# Patient Record
Sex: Male | Born: 1950 | Race: Black or African American | Hispanic: No | Marital: Married | Smoking: Former smoker
Health system: Southern US, Community
[De-identification: ages and names within clinical notes are randomized; demographics above are authoritative.]

## PROBLEM LIST (undated history)

## (undated) DIAGNOSIS — E119 Type 2 diabetes mellitus without complications: Secondary | ICD-10-CM

## (undated) DIAGNOSIS — K219 Gastro-esophageal reflux disease without esophagitis: Secondary | ICD-10-CM

## (undated) DIAGNOSIS — N529 Male erectile dysfunction, unspecified: Secondary | ICD-10-CM

## (undated) DIAGNOSIS — E291 Testicular hypofunction: Secondary | ICD-10-CM

## (undated) HISTORY — PX: OTHER SURGICAL HISTORY: SHX169

## (undated) HISTORY — DX: Gastro-esophageal reflux disease without esophagitis: K21.9

## (undated) HISTORY — DX: Male erectile dysfunction, unspecified: N52.9

## (undated) HISTORY — PX: TONSILLECTOMY: SUR1361

## (undated) HISTORY — DX: Testicular hypofunction: E29.1

---

## 1998-07-31 ENCOUNTER — Emergency Department (HOSPITAL_COMMUNITY): Admission: EM | Admit: 1998-07-31 | Discharge: 1998-07-31 | Payer: Self-pay | Admitting: Emergency Medicine

## 1999-02-22 ENCOUNTER — Ambulatory Visit (HOSPITAL_COMMUNITY): Admission: RE | Admit: 1999-02-22 | Discharge: 1999-02-22 | Payer: Self-pay | Admitting: Cardiology

## 1999-02-22 ENCOUNTER — Encounter: Payer: Self-pay | Admitting: Cardiology

## 2000-07-16 ENCOUNTER — Emergency Department (HOSPITAL_COMMUNITY): Admission: EM | Admit: 2000-07-16 | Discharge: 2000-07-16 | Payer: Self-pay | Admitting: Emergency Medicine

## 2001-04-01 ENCOUNTER — Encounter: Payer: Self-pay | Admitting: Cardiology

## 2001-04-01 ENCOUNTER — Ambulatory Visit (HOSPITAL_COMMUNITY): Admission: RE | Admit: 2001-04-01 | Discharge: 2001-04-01 | Payer: Self-pay | Admitting: Cardiology

## 2002-02-26 ENCOUNTER — Ambulatory Visit (HOSPITAL_COMMUNITY): Admission: RE | Admit: 2002-02-26 | Discharge: 2002-02-26 | Payer: Self-pay | Admitting: Cardiology

## 2003-06-03 ENCOUNTER — Emergency Department (HOSPITAL_COMMUNITY): Admission: EM | Admit: 2003-06-03 | Discharge: 2003-06-03 | Payer: Self-pay | Admitting: Emergency Medicine

## 2004-12-19 ENCOUNTER — Ambulatory Visit: Payer: Self-pay | Admitting: Internal Medicine

## 2006-11-29 ENCOUNTER — Encounter: Admission: RE | Admit: 2006-11-29 | Discharge: 2006-11-29 | Payer: Self-pay | Admitting: Cardiology

## 2008-05-04 ENCOUNTER — Encounter: Admission: RE | Admit: 2008-05-04 | Discharge: 2008-05-04 | Payer: Self-pay | Admitting: Cardiology

## 2008-10-17 ENCOUNTER — Emergency Department (HOSPITAL_COMMUNITY): Admission: EM | Admit: 2008-10-17 | Discharge: 2008-10-17 | Payer: Self-pay | Admitting: Emergency Medicine

## 2008-10-17 ENCOUNTER — Emergency Department (HOSPITAL_COMMUNITY): Admission: EM | Admit: 2008-10-17 | Discharge: 2008-10-18 | Payer: Self-pay | Admitting: Emergency Medicine

## 2009-05-06 ENCOUNTER — Encounter: Admission: RE | Admit: 2009-05-06 | Discharge: 2009-05-06 | Payer: Self-pay | Admitting: Cardiology

## 2009-05-16 ENCOUNTER — Emergency Department (HOSPITAL_COMMUNITY): Admission: EM | Admit: 2009-05-16 | Discharge: 2009-05-16 | Payer: Self-pay | Admitting: Family Medicine

## 2009-10-28 ENCOUNTER — Encounter: Admission: RE | Admit: 2009-10-28 | Discharge: 2009-10-28 | Payer: Self-pay | Admitting: Cardiology

## 2010-07-27 DIAGNOSIS — R634 Abnormal weight loss: Secondary | ICD-10-CM | POA: Insufficient documentation

## 2010-07-27 DIAGNOSIS — E119 Type 2 diabetes mellitus without complications: Secondary | ICD-10-CM | POA: Insufficient documentation

## 2010-07-27 DIAGNOSIS — R42 Dizziness and giddiness: Secondary | ICD-10-CM | POA: Insufficient documentation

## 2010-07-27 DIAGNOSIS — R632 Polyphagia: Secondary | ICD-10-CM | POA: Insufficient documentation

## 2010-07-27 DIAGNOSIS — R631 Polydipsia: Secondary | ICD-10-CM | POA: Insufficient documentation

## 2010-07-27 DIAGNOSIS — R5381 Other malaise: Secondary | ICD-10-CM | POA: Insufficient documentation

## 2010-07-28 ENCOUNTER — Emergency Department (HOSPITAL_COMMUNITY)
Admission: EM | Admit: 2010-07-28 | Discharge: 2010-07-28 | Disposition: A | Discharge status: Home / Self Care | Payer: Self-pay | Attending: Emergency Medicine | Admitting: Emergency Medicine

## 2010-07-28 LAB — URINALYSIS, ROUTINE W REFLEX MICROSCOPIC
Ketones, ur: 15 mg/dL — AB
Leukocytes, UA: NEGATIVE
Nitrite: NEGATIVE
Protein, ur: NEGATIVE mg/dL
Urobilinogen, UA: 0.2 mg/dL (ref 0.0–1.0)

## 2010-07-28 LAB — DIFFERENTIAL
Basophils Absolute: 0.1 10*3/uL (ref 0.0–0.1)
Basophils Relative: 1 % (ref 0–1)
Eosinophils Absolute: 0.6 10*3/uL (ref 0.0–0.7)
Monocytes Absolute: 0.6 10*3/uL (ref 0.1–1.0)
Monocytes Relative: 8 % (ref 3–12)
Neutrophils Relative %: 55 % (ref 43–77)

## 2010-07-28 LAB — GLUCOSE, CAPILLARY
Glucose-Capillary: 345 mg/dL — ABNORMAL HIGH (ref 70–99)
Glucose-Capillary: 524 mg/dL — ABNORMAL HIGH (ref 70–99)
Glucose-Capillary: >600 mg/dL (ref 70–99)

## 2010-07-28 LAB — CBC
MCH: 30.8 pg (ref 26.0–34.0)
MCHC: 36.2 g/dL — ABNORMAL HIGH (ref 30.0–36.0)
Platelets: 194 10*3/uL (ref 150–400)

## 2010-07-28 LAB — COMPREHENSIVE METABOLIC PANEL
Alkaline Phosphatase: 98 U/L (ref 39–117)
BUN: 20 mg/dL (ref 6–23)
Calcium: 9.3 mg/dL (ref 8.4–10.5)
Creatinine, Ser: 1.43 mg/dL (ref 0.4–1.5)
Glucose, Bld: 652 mg/dL (ref 70–99)
Total Protein: 6 g/dL (ref 6.0–8.3)

## 2010-08-12 ENCOUNTER — Emergency Department (HOSPITAL_COMMUNITY)
Admission: EM | Admit: 2010-08-12 | Discharge: 2010-08-12 | Disposition: A | Discharge status: Home / Self Care | Payer: Self-pay | Attending: Emergency Medicine | Admitting: Emergency Medicine

## 2010-08-12 ENCOUNTER — Emergency Department (HOSPITAL_COMMUNITY): Payer: Self-pay

## 2010-08-12 DIAGNOSIS — R109 Unspecified abdominal pain: Secondary | ICD-10-CM | POA: Insufficient documentation

## 2010-08-12 DIAGNOSIS — R197 Diarrhea, unspecified: Secondary | ICD-10-CM | POA: Insufficient documentation

## 2010-08-12 DIAGNOSIS — Z79899 Other long term (current) drug therapy: Secondary | ICD-10-CM | POA: Insufficient documentation

## 2010-08-12 DIAGNOSIS — E119 Type 2 diabetes mellitus without complications: Secondary | ICD-10-CM | POA: Insufficient documentation

## 2010-08-12 DIAGNOSIS — R112 Nausea with vomiting, unspecified: Secondary | ICD-10-CM | POA: Insufficient documentation

## 2010-08-12 LAB — URINALYSIS, ROUTINE W REFLEX MICROSCOPIC
Hgb urine dipstick: NEGATIVE
Protein, ur: NEGATIVE mg/dL
Urobilinogen, UA: 0.2 mg/dL (ref 0.0–1.0)

## 2010-08-12 LAB — DIFFERENTIAL
Basophils Relative: 0 % (ref 0–1)
Monocytes Absolute: 0.7 10*3/uL (ref 0.1–1.0)
Monocytes Relative: 5 % (ref 3–12)
Neutro Abs: 12.4 10*3/uL — ABNORMAL HIGH (ref 1.7–7.7)

## 2010-08-12 LAB — COMPREHENSIVE METABOLIC PANEL
AST: 20 U/L (ref 0–37)
BUN: 15 mg/dL (ref 6–23)
CO2: 23 mEq/L (ref 19–32)
Calcium: 9.5 mg/dL (ref 8.4–10.5)
Creatinine, Ser: 0.94 mg/dL (ref 0.4–1.5)
GFR calc Af Amer: >60 mL/min (ref >60)
GFR calc non Af Amer: >60 mL/min (ref >60)
Glucose, Bld: 319 mg/dL — ABNORMAL HIGH (ref 70–99)

## 2010-08-12 LAB — POCT CARDIAC MARKERS
CKMB, poc: <1 ng/mL — ABNORMAL LOW (ref 1.0–8.0)
Myoglobin, poc: 21 ng/mL (ref 12–200)

## 2010-08-12 LAB — CBC
Hemoglobin: 17.7 g/dL — ABNORMAL HIGH (ref 13.0–17.0)
MCH: 30.3 pg (ref 26.0–34.0)
MCHC: 35.4 g/dL (ref 30.0–36.0)

## 2010-08-12 LAB — GLUCOSE, CAPILLARY: Glucose-Capillary: 196 mg/dL — ABNORMAL HIGH (ref 70–99)

## 2010-08-12 LAB — LIPASE, BLOOD: Lipase: 16 U/L (ref 11–59)

## 2010-08-12 MED ORDER — IOHEXOL 300 MG/ML  SOLN
100.0000 mL | Freq: Once | INTRAMUSCULAR | Status: AC | PRN
  Administered 2010-08-12: 100 mL via INTRAVENOUS

## 2010-08-22 LAB — DIFFERENTIAL
Basophils Relative: 0 % (ref 0–1)
Eosinophils Absolute: 0.5 10*3/uL (ref 0.0–0.7)
Lymphs Abs: 1.2 10*3/uL (ref 0.7–4.0)
Monocytes Absolute: 0.8 10*3/uL (ref 0.1–1.0)
Monocytes Relative: 10 % (ref 3–12)
Neutro Abs: 5.7 10*3/uL (ref 1.7–7.7)
Neutrophils Relative %: 69 % (ref 43–77)

## 2010-08-22 LAB — URINALYSIS, ROUTINE W REFLEX MICROSCOPIC
Bilirubin Urine: NEGATIVE
Ketones, ur: NEGATIVE mg/dL
Nitrite: NEGATIVE
Protein, ur: NEGATIVE mg/dL
Urobilinogen, UA: 1 mg/dL (ref 0.0–1.0)
pH: 6.5 (ref 5.0–8.0)

## 2010-08-22 LAB — COMPREHENSIVE METABOLIC PANEL
ALT: 25 U/L (ref 0–53)
Albumin: 3.6 g/dL (ref 3.5–5.2)
Alkaline Phosphatase: 77 U/L (ref 39–117)
Calcium: 8.5 mg/dL (ref 8.4–10.5)
GFR calc Af Amer: >60 mL/min (ref >60)
Glucose, Bld: 106 mg/dL — ABNORMAL HIGH (ref 70–99)
Potassium: 3.7 mEq/L (ref 3.5–5.1)
Sodium: 136 mEq/L (ref 135–145)
Total Protein: 6.6 g/dL (ref 6.0–8.3)

## 2010-08-22 LAB — CBC
MCHC: 33.6 g/dL (ref 30.0–36.0)
Platelets: 192 10*3/uL (ref 150–400)
RDW: 13.6 % (ref 11.5–15.5)

## 2010-12-14 ENCOUNTER — Inpatient Hospital Stay (INDEPENDENT_AMBULATORY_CARE_PROVIDER_SITE_OTHER)
Admission: RE | Admit: 2010-12-14 | Discharge: 2010-12-14 | Disposition: A | Discharge status: Home / Self Care | Payer: Self-pay | Source: Ambulatory Visit | Attending: Family Medicine | Admitting: Family Medicine

## 2010-12-14 DIAGNOSIS — N400 Enlarged prostate without lower urinary tract symptoms: Secondary | ICD-10-CM

## 2010-12-14 DIAGNOSIS — E119 Type 2 diabetes mellitus without complications: Secondary | ICD-10-CM

## 2010-12-14 LAB — POCT URINALYSIS DIP (DEVICE)
Bilirubin Urine: NEGATIVE
Glucose, UA: 500 mg/dL — AB
Hgb urine dipstick: NEGATIVE
Specific Gravity, Urine: 1.01 (ref 1.005–1.030)
pH: 5 (ref 5.0–8.0)

## 2010-12-14 LAB — GLUCOSE, CAPILLARY

## 2011-12-27 IMAGING — CR DG ABDOMEN ACUTE W/ 1V CHEST
3 series · 3 of 3 positions shown · non-contrast
Comparison: Lumbar spine series dated 10/28/2009

CLINICAL DATA: Abdominal pain, vomiting, nausea and diarrhea

ACUTE ABDOMEN SERIES (ABDOMEN 2 VIEW & CHEST 1 VIEW)

[w chest pa]
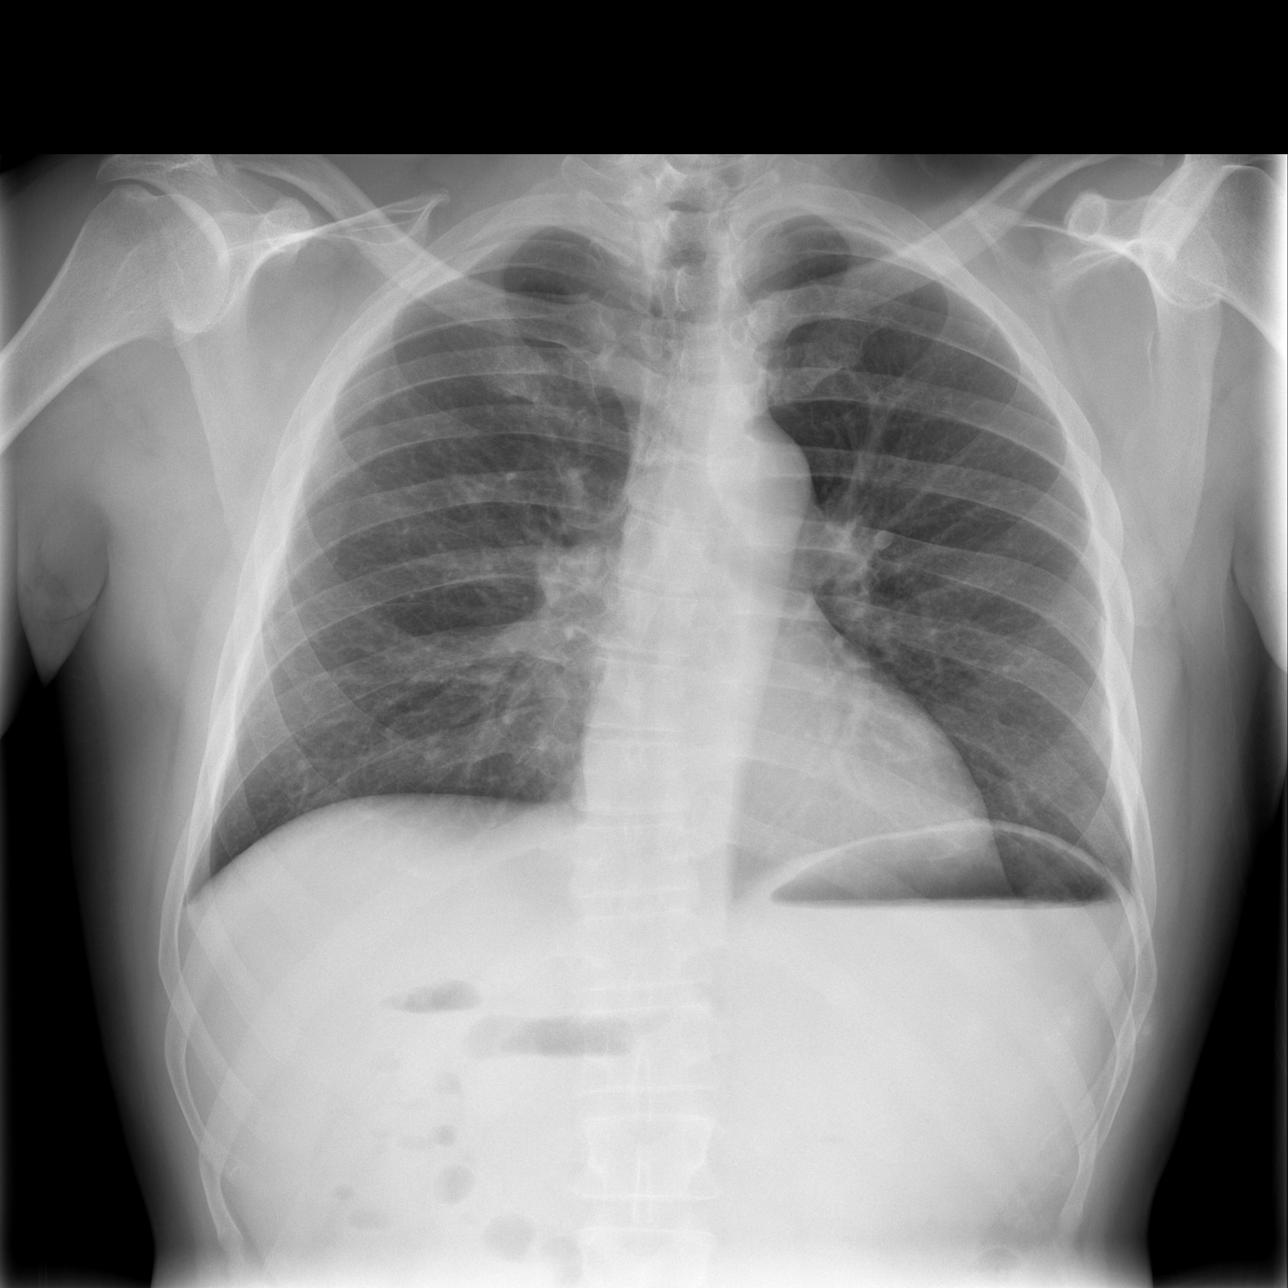

[w abdomen upright]
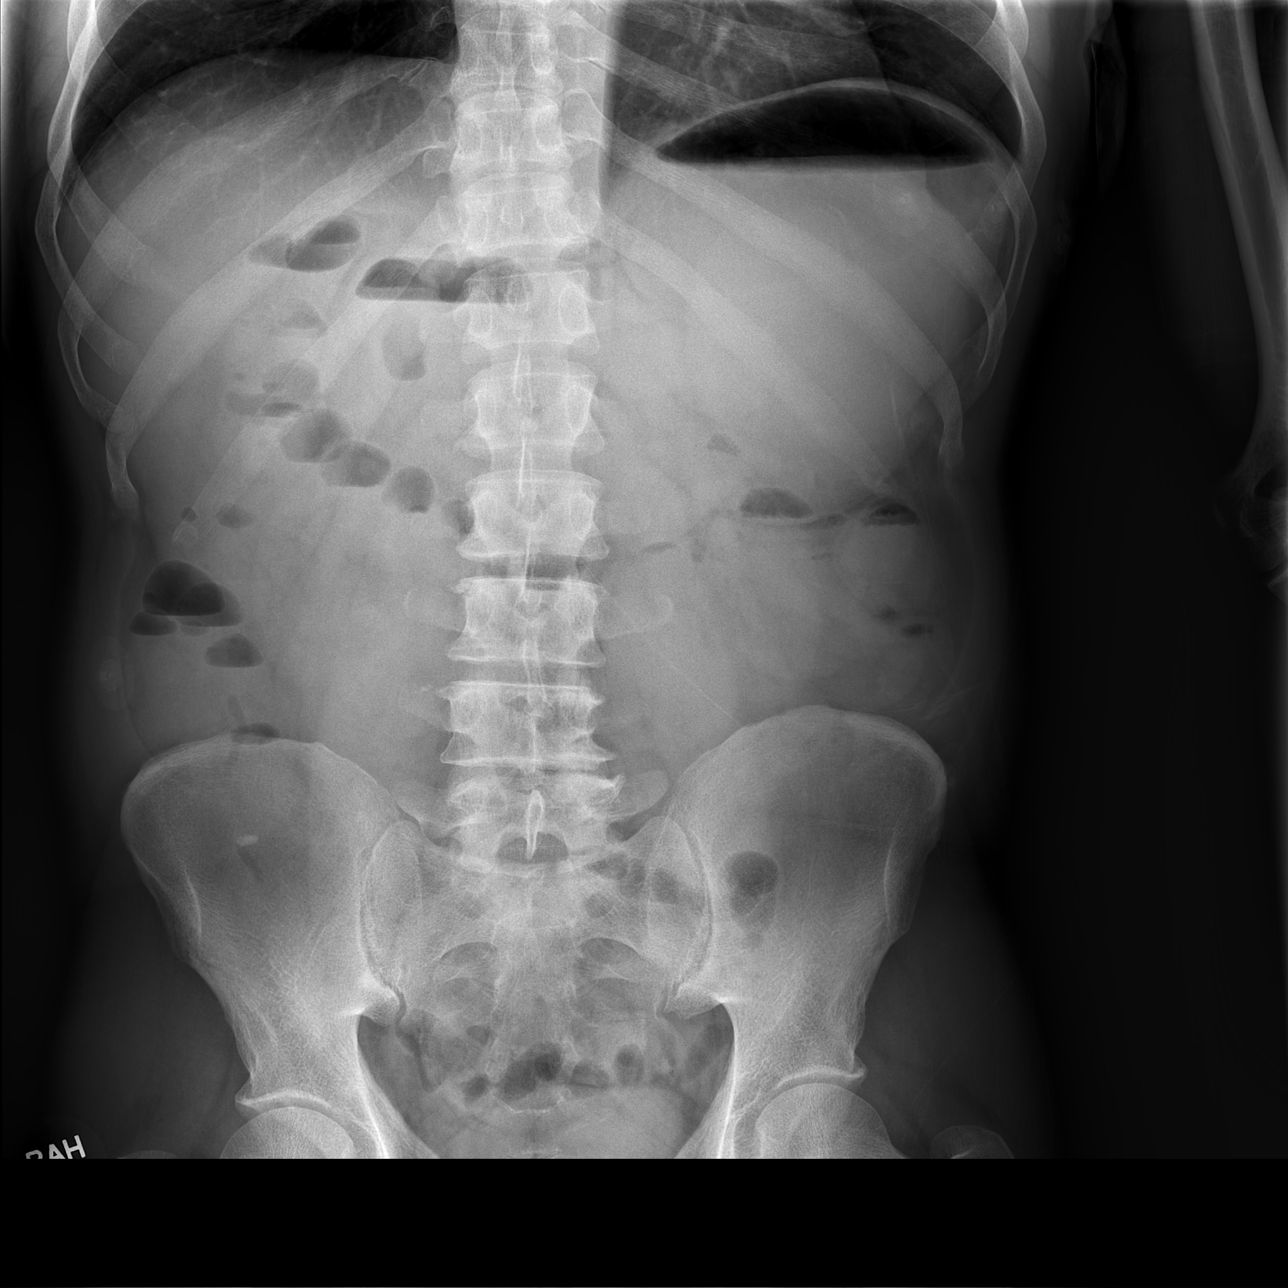

[t abdomen supine]
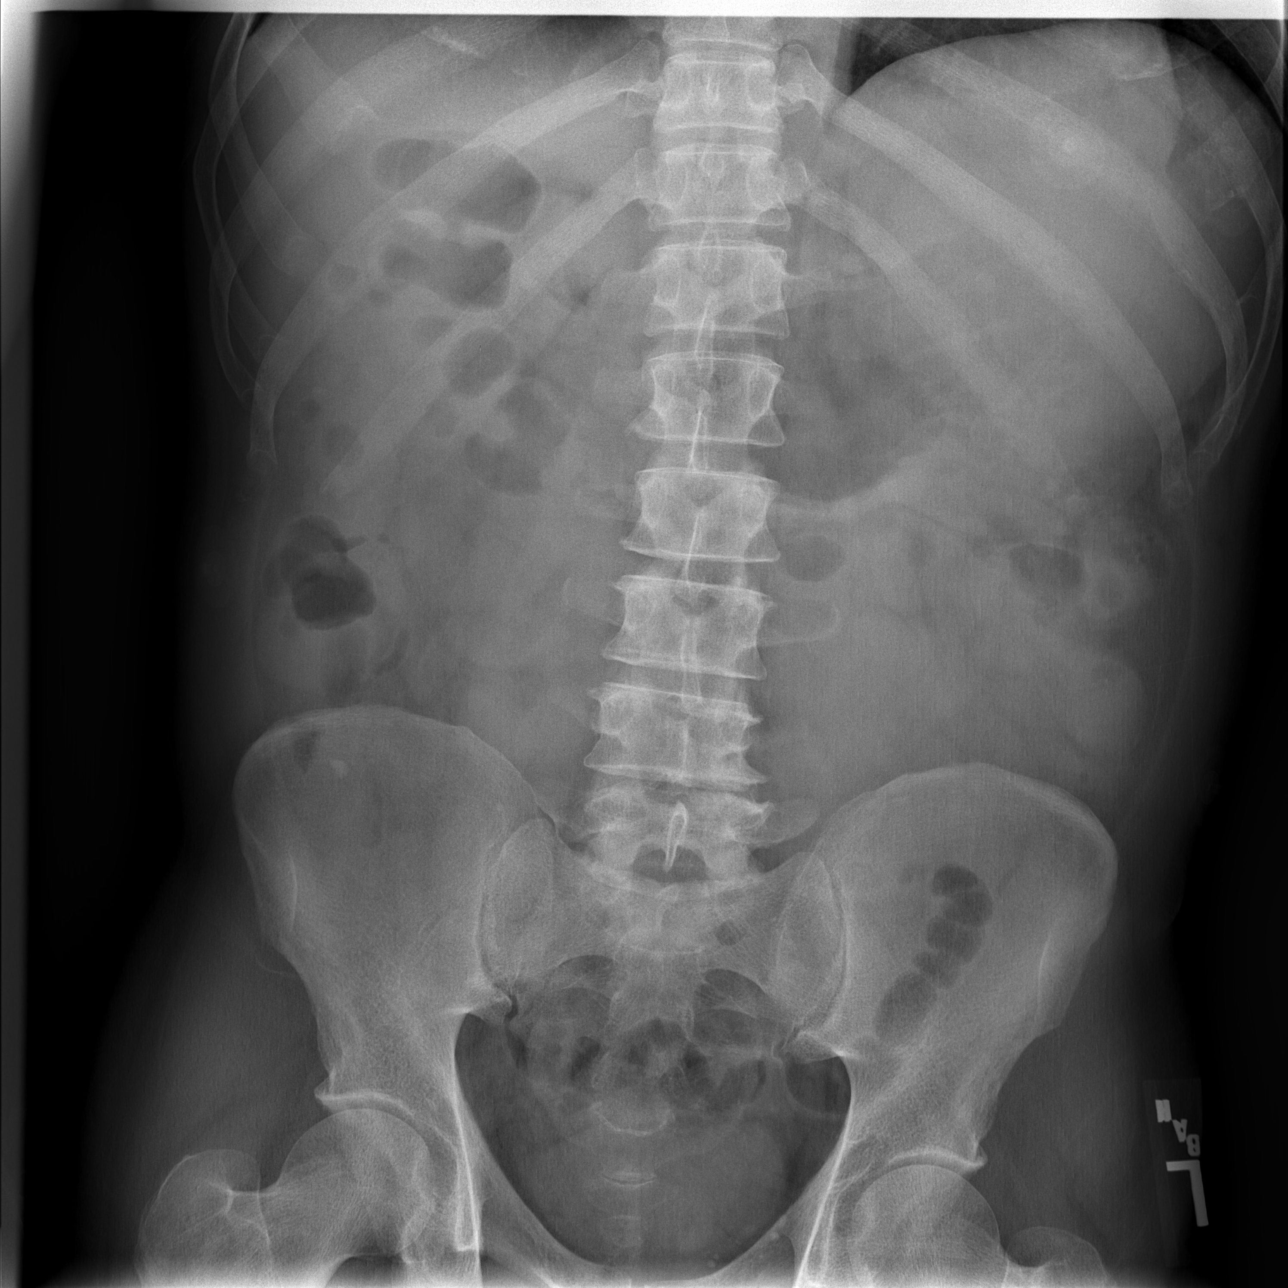

[3 of 3 positions shown; findings below may reference images not displayed]

FINDINGS: The lungs are clear.  There is no intra-abdominal free
air.  Air fluid levels are seen in the colon. No dilated small
bowel loops are seen.  Gas is seen in the appendix which is normal
in caliber.  A round calcification projects in the right lower
quadrant, separate from the appendix and likely of no significance.
4 mm calcification is seen to the right of the L5 vertebral body
which could relate to a small renal stone.
IMPRESSION: 1.  No acute cardiopulmonary disease.
2.  Air fluid level.  The colon which is nonspecific but can be
seen with infectious colitis.
3.  Probable nonobstructing right renal stone in the horseshoe
kidney.

## 2012-06-08 ENCOUNTER — Encounter (HOSPITAL_COMMUNITY): Payer: Self-pay | Admitting: *Deleted

## 2012-06-08 ENCOUNTER — Emergency Department (HOSPITAL_COMMUNITY)
Admission: EM | Admit: 2012-06-08 | Discharge: 2012-06-08 | Disposition: A | Discharge status: Home Health | Payer: BC Managed Care – PPO | Attending: Emergency Medicine | Admitting: Emergency Medicine

## 2012-06-08 DIAGNOSIS — R739 Hyperglycemia, unspecified: Secondary | ICD-10-CM

## 2012-06-08 DIAGNOSIS — E1169 Type 2 diabetes mellitus with other specified complication: Secondary | ICD-10-CM | POA: Insufficient documentation

## 2012-06-08 DIAGNOSIS — Z79899 Other long term (current) drug therapy: Secondary | ICD-10-CM | POA: Insufficient documentation

## 2012-06-08 HISTORY — DX: Type 2 diabetes mellitus without complications: E11.9

## 2012-06-08 LAB — POCT I-STAT, CHEM 8
BUN: 15 mg/dL (ref 6–23)
Calcium, Ion: 1.16 mmol/L (ref 1.13–1.30)
Chloride: 103 mEq/L (ref 96–112)
Creatinine, Ser: 0.9 mg/dL (ref 0.50–1.35)
Glucose, Bld: 424 mg/dL — ABNORMAL HIGH (ref 70–99)
Potassium: 4.1 mEq/L (ref 3.5–5.1)

## 2012-06-08 LAB — CBC
HCT: 44.9 % (ref 39.0–52.0)
Hemoglobin: 16.1 g/dL (ref 13.0–17.0)
MCV: 85.2 fL (ref 78.0–100.0)
WBC: 9.9 10*3/uL (ref 4.0–10.5)

## 2012-06-08 LAB — GLUCOSE, CAPILLARY
Glucose-Capillary: 376 mg/dL — ABNORMAL HIGH (ref 70–99)
Glucose-Capillary: 164 mg/dL — ABNORMAL HIGH (ref 70–99)
Glucose-Capillary: 149 mg/dL — ABNORMAL HIGH (ref 70–99)

## 2012-06-08 MED ORDER — DEXTROSE-NACL 5-0.45 % IV SOLN: INTRAVENOUS | Status: DC

## 2012-06-08 MED ORDER — DEXTROSE 50 % IV SOLN: 25.0000 mL | INTRAVENOUS | Status: DC | PRN

## 2012-06-08 MED ORDER — ACETAMINOPHEN 325 MG PO TABS
650.0000 mg | ORAL_TABLET | Freq: Once | ORAL | Status: AC
  Administered 2012-06-08: 650 mg via ORAL
  Filled 2012-06-08: qty 1

## 2012-06-08 MED ORDER — HYDROCODONE-ACETAMINOPHEN 5-325 MG PO TABS
1.0000 | ORAL_TABLET | Freq: Once | ORAL | Status: AC
  Administered 2012-06-08: 1 via ORAL
  Filled 2012-06-08: qty 1

## 2012-06-08 MED ORDER — GLIMEPIRIDE 4 MG PO TABS: 4.0000 mg | ORAL_TABLET | Freq: Every day | ORAL | Status: DC

## 2012-06-08 MED ORDER — SODIUM CHLORIDE 0.9 % IV SOLN: 1000.0000 mL | INTRAVENOUS | Status: DC

## 2012-06-08 MED ORDER — INSULIN ASPART 100 UNIT/ML ~~LOC~~ SOLN
10.0000 [IU] | Freq: Once | SUBCUTANEOUS | Status: AC
  Administered 2012-06-08: 10 [IU] via SUBCUTANEOUS
  Filled 2012-06-08: qty 1

## 2012-06-08 MED ORDER — SODIUM CHLORIDE 0.9 % IV SOLN: INTRAVENOUS | Status: DC

## 2012-06-08 MED ORDER — HYDROCODONE-ACETAMINOPHEN 5-325 MG PO TABS: 1.0000 | ORAL_TABLET | ORAL | Status: DC | PRN

## 2012-06-08 MED ORDER — SODIUM CHLORIDE 0.9 % IV BOLUS (SEPSIS): 1000.0000 mL | Freq: Once | INTRAVENOUS | Status: DC

## 2012-06-08 MED ORDER — INSULIN ASPART 100 UNIT/ML ~~LOC~~ SOLN
6.0000 [IU] | Freq: Once | SUBCUTANEOUS | Status: AC
  Administered 2012-06-08: 6 [IU] via SUBCUTANEOUS
  Filled 2012-06-08: qty 1

## 2012-06-08 MED ORDER — SODIUM CHLORIDE 0.9 % IV BOLUS (SEPSIS)
1000.0000 mL | Freq: Once | INTRAVENOUS | Status: AC
  Administered 2012-06-08: 1000 mL via INTRAVENOUS

## 2012-06-08 MED ORDER — METFORMIN HCL 1000 MG PO TABS: 1000.0000 mg | ORAL_TABLET | Freq: Every day | ORAL | Status: DC

## 2012-06-08 MED ORDER — SODIUM CHLORIDE 0.9 % IV BOLUS (SEPSIS)
1000.0000 mL | Freq: Once | INTRAVENOUS | Status: AC
  Administered 2012-06-08 (×2): 1000 mL via INTRAVENOUS

## 2012-06-08 MED ORDER — SODIUM CHLORIDE 0.9 % IV SOLN
INTRAVENOUS | Status: DC
  Filled 2012-06-08: qty 1

## 2012-06-08 MED ORDER — INSULIN REGULAR BOLUS VIA INFUSION
0.0000 [IU] | Freq: Three times a day (TID) | INTRAVENOUS | Status: DC
  Filled 2012-06-08: qty 10

## 2012-06-08 MED ORDER — FAMOTIDINE 20 MG PO TABS
20.0000 mg | ORAL_TABLET | Freq: Once | ORAL | Status: AC
  Administered 2012-06-08: 20 mg via ORAL
  Filled 2012-06-08: qty 1

## 2012-06-08 NOTE — ED Notes (Signed)
Pt sleeping, wife at bedside.

## 2012-06-08 NOTE — ED Provider Notes (Signed)
Medical screening examination/treatment/procedure(s) were performed by non-physician practitioner and as supervising physician I was immediately available for consultation/collaboration.  Laylani Pudwill, MD 06/08/12 0553 

## 2012-06-08 NOTE — ED Notes (Signed)
FSBS is 412

## 2012-06-08 NOTE — ED Notes (Signed)
Bilateral cramps in lower legs. Onset: 06/07/2012. Tendonitis on rt. Hand side of leg. Burn, stings.

## 2012-06-08 NOTE — ED Notes (Signed)
3 literof NS infusing. Pt is sleeping.  Wife at bedside

## 2012-06-08 NOTE — ED Provider Notes (Signed)
History     CSN: [id number]  Arrival date & time 06/08/12  0026   First MD Initiated Contact with Patient 06/08/12 0104      Chief Complaint  Patient presents with  . Extremity Pain    (Consider location/radiation/quality/duration/timing/severity/associated sxs/prior treatment) HPI  Presents to the emergency department for bilateral lower leg cramps that started yesterday, Friday, at 1pm. His left leg he also describes as having a burning staining sensation without injury. He is a diabetic supposed to be on glimepiride and metformin but he has not been taking it for the past month. He has insurance he says that he ran out of his prescription and just never went to go get more. He is also not been checking his sugar either. It was noted in triage that his CBG is very elevated. He denies having any weakness, increased urination, diarrhea, fevers, difficulty breathing. The patient is in no acute distress vital signs are stable  Past Medical History  Diagnosis Date  . Diabetes mellitus without complication     History reviewed. No pertinent past surgical history.  No family history on file.  History  Substance Use Topics  . Smoking status: Never Smoker   . Smokeless tobacco: Not on file  . Alcohol Use: No      Review of Systems  Review of Systems  Gen: no weight loss, fevers, chills, night sweats  Eyes: no discharge or drainage, no occular pain or visual changes  Nose: no epistaxis or rhinorrhea  Mouth: no dental pain, no sore throat  Neck: no neck pain  Lungs:No wheezing, coughing or hemoptysis CV: no chest pain, palpitations, dependent edema or orthopnea  Abd: no abdominal pain, nausea, vomiting  GU: no dysuria or gross hematuria  MSK:  Bilateral leg pain and cramping Neuro: no headache, no focal neurologic deficits  Skin: no abnormalities Psyche: negative.   Allergies  Review of patient's allergies indicates no known allergies.  Home Medications   Current  Outpatient Rx  Name  Route  Sig  Dispense  Refill  . GLIMEPIRIDE 4 MG PO TABS   Oral   Take 1 tablet (4 mg total) by mouth daily before breakfast.   30 tablet   1   . METFORMIN HCL 1000 MG PO TABS   Oral   Take 1 tablet (1,000 mg total) by mouth daily with breakfast.   30 tablet   1     BP 134/74  Pulse 89  Temp 98.3 F (36.8 C) (Oral)  Resp 20  SpO2 97%  Physical Exam  Nursing note and vitals reviewed. Constitutional: He appears well-developed and well-nourished. No distress.  HENT:  Head: Normocephalic and atraumatic.  Eyes: Pupils are equal, round, and reactive to light.  Neck: Normal range of motion. Neck supple.  Cardiovascular: Normal rate and regular rhythm.   Pulmonary/Chest: Effort normal.  Abdominal: Soft.  Musculoskeletal:       Pt grabbing toes during exam because he is actively cramping.  Neurological: He is alert.  Skin: Skin is warm and dry.    ED Course  Procedures (including critical care time)  Labs Reviewed  POCT I-STAT, CHEM 8 - Abnormal; Notable for the following:    Glucose, Bld 424 (*)     All other components within normal limits  GLUCOSE, CAPILLARY - Abnormal; Notable for the following:    Glucose-Capillary 414 (*)     All other components within normal limits  GLUCOSE, CAPILLARY - Abnormal; Notable for the following:  Glucose-Capillary 376 (*)     All other components within normal limits  GLUCOSE, CAPILLARY - Abnormal; Notable for the following:    Glucose-Capillary 164 (*)     All other components within normal limits  GLUCOSE, CAPILLARY - Abnormal; Notable for the following:    Glucose-Capillary 149 (*)     All other components within normal limits  CBC   No results found.   1. Hyperglycemia without ketosis       MDM  He has been initially given 2 L of normal saline and 10 subcutaneous units of normal insulin.  After an hour his CBG has gone from 424 to 376. His other labwork is unremarkable.  1 L NS and 6 units  insulin ordered.   Pt ate [place] sandwich and drank ginger ale in ED. Feeling well. Glucose at discharge is 149 which he states is his normal. He requests pain medication for his nerve pain. Will give small Rx for Vicodin. Have prescribed his diabetes medications. Pt urged to follow-up with PCP to get reconnected and get his routine check up.  Pt has been advised of the symptoms that warrant their return to the ED. Patient has voiced understanding and has agreed to follow-up with the PCP or specialist.       Dorthula Matas, PA 06/08/12 [phone]

## 2012-06-08 NOTE — ED Notes (Signed)
Pt and wife given [place] sandwich and diet soda

## 2012-06-08 NOTE — ED Notes (Signed)
Watching TV with fmily member, pt wet his bed when using urinal, linens changed, warm blanket provided

## 2014-03-19 ENCOUNTER — Telehealth: Payer: Self-pay | Admitting: Cardiology

## 2014-03-19 NOTE — Telephone Encounter (Signed)
Ok with me 

## 2014-03-19 NOTE — Telephone Encounter (Signed)
Pt's Spouse, [name] is a current pt of yours and came into the office wanting to know if you would be willing to accept her Spouse, Michael Mcmillan, as a new pt?  Please advise.  Thank you.

## 2014-03-20 NOTE — Telephone Encounter (Signed)
Left patient vm to schedule appt °

## 2014-05-07 ENCOUNTER — Other Ambulatory Visit: Payer: Self-pay | Admitting: Internal Medicine

## 2014-05-07 ENCOUNTER — Encounter: Payer: Self-pay | Admitting: Internal Medicine

## 2014-05-07 ENCOUNTER — Other Ambulatory Visit: Payer: Self-pay

## 2014-05-07 ENCOUNTER — Other Ambulatory Visit (INDEPENDENT_AMBULATORY_CARE_PROVIDER_SITE_OTHER): Payer: BC Managed Care – PPO

## 2014-05-07 ENCOUNTER — Ambulatory Visit (INDEPENDENT_AMBULATORY_CARE_PROVIDER_SITE_OTHER): Payer: BC Managed Care – PPO | Admitting: Internal Medicine

## 2014-05-07 ENCOUNTER — Ambulatory Visit: Payer: BC Managed Care – PPO | Admitting: Internal Medicine

## 2014-05-07 VITALS — BP 142/78 | HR 72 | Temp 98.0°F | Ht 65.0 in | Wt 158.4 lb

## 2014-05-07 DIAGNOSIS — K219 Gastro-esophageal reflux disease without esophagitis: Secondary | ICD-10-CM

## 2014-05-07 DIAGNOSIS — N529 Male erectile dysfunction, unspecified: Secondary | ICD-10-CM

## 2014-05-07 DIAGNOSIS — E119 Type 2 diabetes mellitus without complications: Secondary | ICD-10-CM

## 2014-05-07 DIAGNOSIS — Z23 Encounter for immunization: Secondary | ICD-10-CM

## 2014-05-07 DIAGNOSIS — Z0001 Encounter for general adult medical examination with abnormal findings: Secondary | ICD-10-CM | POA: Insufficient documentation

## 2014-05-07 DIAGNOSIS — E291 Testicular hypofunction: Secondary | ICD-10-CM

## 2014-05-07 DIAGNOSIS — Z0000 Encounter for general adult medical examination without abnormal findings: Secondary | ICD-10-CM

## 2014-05-07 LAB — BASIC METABOLIC PANEL
BUN: 20 mg/dL (ref 6–23)
CO2: 23 meq/L (ref 19–32)
Calcium: 9 mg/dL (ref 8.4–10.5)
Chloride: 108 mEq/L (ref 96–112)
Creatinine, Ser: 1 mg/dL (ref 0.4–1.5)
GFR: 99.29 mL/min (ref >60.00)
Glucose, Bld: 289 mg/dL — ABNORMAL HIGH (ref 70–99)
Potassium: 4.8 mEq/L (ref 3.5–5.1)
SODIUM: 137 meq/L (ref 135–145)

## 2014-05-07 LAB — HEPATIC FUNCTION PANEL
ALK PHOS: 83 U/L (ref 39–117)
ALT: 17 U/L (ref 0–53)
AST: 12 U/L (ref 0–37)
Albumin: 3.9 g/dL (ref 3.5–5.2)
BILIRUBIN DIRECT: 0.1 mg/dL (ref 0.0–0.3)
Total Bilirubin: 0.4 mg/dL (ref 0.2–1.2)
Total Protein: 6.5 g/dL (ref 6.0–8.3)

## 2014-05-07 LAB — URINALYSIS, ROUTINE W REFLEX MICROSCOPIC
Bilirubin Urine: NEGATIVE
HGB URINE DIPSTICK: NEGATIVE
KETONES UR: NEGATIVE
Leukocytes, UA: NEGATIVE
Nitrite: NEGATIVE
Specific Gravity, Urine: 1.02 (ref 1.000–1.030)
Total Protein, Urine: NEGATIVE
Urobilinogen, UA: 0.2 (ref 0.0–1.0)
pH: 5.5 (ref 5.0–8.0)

## 2014-05-07 LAB — CBC WITH DIFFERENTIAL/PLATELET
BASOS ABS: 0 10*3/uL (ref 0.0–0.1)
Basophils Relative: 0.3 % (ref 0.0–3.0)
EOS ABS: 0.6 10*3/uL (ref 0.0–0.7)
Eosinophils Relative: 6.7 % — ABNORMAL HIGH (ref 0.0–5.0)
HEMATOCRIT: 46 % (ref 39.0–52.0)
HEMOGLOBIN: 15.3 g/dL (ref 13.0–17.0)
LYMPHS ABS: 1.9 10*3/uL (ref 0.7–4.0)
Lymphocytes Relative: 23 % (ref 12.0–46.0)
MCHC: 33.3 g/dL (ref 30.0–36.0)
MCV: 89.7 fl (ref 78.0–100.0)
Monocytes Absolute: 0.6 10*3/uL (ref 0.1–1.0)
Monocytes Relative: 7.2 % (ref 3.0–12.0)
NEUTROS ABS: 5.3 10*3/uL (ref 1.4–7.7)
Neutrophils Relative %: 62.8 % (ref 43.0–77.0)
Platelets: 220 10*3/uL (ref 150.0–400.0)
RBC: 5.13 Mil/uL (ref 4.22–5.81)
RDW: 13.9 % (ref 11.5–15.5)
WBC: 8.4 10*3/uL (ref 4.0–10.5)

## 2014-05-07 LAB — LIPID PANEL
Cholesterol: 134 mg/dL (ref 0–200)
HDL: 39.1 mg/dL (ref >39.00)
LDL Cholesterol: 74 mg/dL (ref 0–99)
NonHDL: 94.9
Total CHOL/HDL Ratio: 3
Triglycerides: 107 mg/dL (ref 0.0–149.0)
VLDL: 21.4 mg/dL (ref 0.0–40.0)

## 2014-05-07 LAB — TSH: TSH: 0.36 u[IU]/mL (ref 0.35–4.50)

## 2014-05-07 LAB — PSA: PSA: 2.2 ng/mL (ref 0.10–4.00)

## 2014-05-07 LAB — HEMOGLOBIN A1C: HEMOGLOBIN A1C: 8.4 % — AB (ref 4.6–6.5)

## 2014-05-07 LAB — TESTOSTERONE: TESTOSTERONE: 142.85 ng/dL — AB (ref 300.00–890.00)

## 2014-05-07 MED ORDER — GLIPIZIDE ER 5 MG PO TB24: 5.0000 mg | ORAL_TABLET | Freq: Every day | ORAL | Status: DC

## 2014-05-07 MED ORDER — ASPIRIN EC 81 MG PO TBEC: 81.0000 mg | DELAYED_RELEASE_TABLET | Freq: Every day | ORAL | Status: AC

## 2014-05-07 MED ORDER — PANTOPRAZOLE SODIUM 40 MG PO TBEC: 40.0000 mg | DELAYED_RELEASE_TABLET | Freq: Every day | ORAL | Status: DC

## 2014-05-07 MED ORDER — TESTOSTERONE 50 MG/5GM (1%) TD GEL: 5.0000 g | Freq: Every day | TRANSDERMAL | Status: DC

## 2014-05-07 NOTE — Assessment & Plan Note (Signed)
[place] for total testosterone,  to f/u any worsening symptoms or concerns, consider tx

## 2014-05-07 NOTE — Progress Notes (Signed)
Pre visit review using our clinic review tool, if applicable. No additional management support is needed unless otherwise documented below in the visit note. 

## 2014-05-07 NOTE — Patient Instructions (Addendum)
You had the flu shot today, and tetanus (Tdap)  Please make a Nurse Visit appt for the Prevnar pneumonia shot in 14 days or after  OK to stop the glimeparide 4 mg pill (and the metformin)  Please take all new medication as prescribed - the glipizide ER 5 mg per day, and the generic protonix for acid reflux  Please also start Aspirin 81 mg per day - (enteric coated only) to help reduce risk of stroke and heart disease  Please continue all other medications as before, and refills have been done if requested.  Please have the pharmacy call with any other refills you may need.  Please continue your efforts at being more active, low cholesterol diet, and weight control.  You are otherwise up to date with prevention measures today.  Please keep your appointments with your specialists as you may have planned  You will be contacted regarding the referral for: colonoscopy (on a thurs or Friday)  Please go to the LAB in the Basement (turn left off the elevator) for the tests to be done today  You will be contacted by phone if any changes need to be made immediately.  Otherwise, you will receive a letter about your results with an explanation, but please check with MyChart first.  Please remember to sign up for MyChart if you have not done so, as this will be important to you in the future with finding out test results, communicating by private email, and scheduling acute appointments online when needed.  Please return in 6 months, or sooner if needed, with Lab testing done 3-5 days before

## 2014-05-07 NOTE — Assessment & Plan Note (Signed)
For protonix 40 qd,  to f/u any worsening symptoms or concerns 

## 2014-05-07 NOTE — Assessment & Plan Note (Signed)

## 2014-05-07 NOTE — Telephone Encounter (Signed)
Faxed androgel to [facility]

## 2014-05-07 NOTE — Assessment & Plan Note (Signed)
Ok to d/c the metformin, and change the glimeparide 4 mg qam to glipizide xl 5 qd, check labs

## 2014-05-07 NOTE — Progress Notes (Signed)
Subjective:    Patient ID: Michael Mcmillan, male    DOB: 20-Dec-1950, 63 y.o.   MRN: [id number]  HPI  Here for wellness and to establish as new pt;  Overall doing ok;  Pt denies CP, worsening SOB, DOE, wheezing, orthopnea, PND, worsening LE edema, palpitations, dizziness or syncope.  Pt denies neurological change such as new headache, facial or extremity weakness.  Pt denies polydipsia, polyuria, or low sugar symptoms. Pt states overall good compliance with treatment and medications, good tolerability, and has been trying to follow lower cholesterol diet.  Pt denies worsening depressive symptoms, suicidal ideation or panic. No fever, night sweats, wt loss, loss of appetite, or other constitutional symptoms.  Pt states good ability with ADL's, has low fall risk, home safety reviewed and adequate, no other significant changes in hearing or vision, and only occasionally active with exercise. Employed as truck Geophysicist/field seismologist, no recent low sugars, but cannot tolerate the 1000 mg metformin and only takes a few days each month as causes loose stools and cannot work in that condition.  Has not been able to take the 500 mg either in the past, with similar results.  Asks for testosterone check as well, has hx of low testost, has ongoing ED and hopes tx for hypogonad would help. Has had mild worsening reflux in last few wks, but no abd pain, dysphagia, n/v, bowel change or blood. Past Medical History  Diagnosis Date  . Diabetes mellitus without complication   . GERD (gastroesophageal reflux disease)   . Hypogonadism in male   . Erectile dysfunction    Past Surgical History  Procedure Laterality Date  . Right arm torn ligaments Right approx 1985    reports that he has never smoked. He does not have any smokeless tobacco history on file. He reports that he does not drink alcohol or use illicit drugs. family history includes Bone cancer in his mother; Colon cancer in his father. Allergies  Allergen Reactions  .  Metformin And Related Diarrhea   No current outpatient prescriptions on file prior to visit.   No current facility-administered medications on file prior to visit.    Review of Systems Constitutional: Negative for increased diaphoresis, other activity, appetite or other siginficant weight change  HENT: Negative for worsening hearing loss, ear pain, facial swelling, mouth sores and neck stiffness.   Eyes: Negative for other worsening pain, redness or visual disturbance.  Respiratory: Negative for shortness of breath and wheezing.   Cardiovascular: Negative for chest pain and palpitations.  Gastrointestinal: Negative for diarrhea, blood in stool, abdominal distention or other pain Genitourinary: Negative for hematuria, flank pain or change in urine volume.  Musculoskeletal: Negative for myalgias or other joint complaints.  Skin: Negative for color change and wound.  Neurological: Negative for syncope and numbness. other than noted Hematological: Negative for adenopathy. or other swelling Psychiatric/Behavioral: Negative for hallucinations, self-injury, decreased concentration or other worsening agitation.      Objective:   Physical Exam BP 142/78 mmHg  Pulse 72  Temp(Src) 98 F (36.7 C) (Oral)  Ht 5\' 5"  (1.651 m)  Wt 158 lb 6 oz (71.838 kg)  BMI 26.35 kg/m2  SpO2 97% VS noted,  Constitutional: Pt is oriented to person, place, and time. Appears well-developed and well-nourished.  Head: Normocephalic and atraumatic.  Right Ear: External ear normal.  Left Ear: External ear normal.  Nose: Nose normal.  Mouth/Throat: Oropharynx is clear and moist.  Eyes: Conjunctivae and EOM are normal. Pupils are equal, round,  and reactive to light.  Neck: Normal range of motion. Neck supple. No JVD present. No tracheal deviation present.  Cardiovascular: Normal rate, regular rhythm, normal heart sounds and intact distal pulses.   Pulmonary/Chest: Effort normal and breath sounds without rales or  wheezing  Abdominal: Soft. Bowel sounds are normal. NT. No HSM  Musculoskeletal: Normal range of motion. Exhibits no edema.  Lymphadenopathy:  Has no cervical adenopathy.  Neurological: Pt is alert and oriented to person, place, and time. Pt has normal reflexes. No cranial nerve deficit. Motor grossly intact Skin: Skin is warm and dry. No rash noted.  Psychiatric:  Has normal mood and affect. Behavior is normal.     Assessment & Plan:

## 2014-05-11 ENCOUNTER — Encounter: Payer: Self-pay | Admitting: Internal Medicine

## 2014-05-11 ENCOUNTER — Telehealth: Payer: Self-pay | Admitting: Internal Medicine

## 2014-05-11 NOTE — Telephone Encounter (Signed)
Pt's wife Michael Mcmillan called and states Michael Mcmillan has not rec'd the faxed script from PCP office of androgel and asked that we re-fax.

## 2014-05-12 MED ORDER — TESTOSTERONE 50 MG/5GM (1%) TD GEL: 5.0000 g | Freq: Every day | TRANSDERMAL | Status: DC

## 2014-05-12 NOTE — Telephone Encounter (Signed)
Faxed script back to [facility]...Johny Chess

## 2014-05-12 NOTE — Telephone Encounter (Signed)
Done hardcopy to robin  

## 2014-05-20 ENCOUNTER — Telehealth: Payer: Self-pay | Admitting: Internal Medicine

## 2014-05-20 NOTE — Telephone Encounter (Signed)
Wife called in and said that sugar is still running high with this med. Requesting a call back from the nurse.  [phone]

## 2014-05-20 NOTE — Telephone Encounter (Signed)
Called the wife and she stated the patient had been drinking a lot of egg nog and [place] fried chicken (BS was as high as 280) over the holidays, believes that may be the cause. The wife stated he is now taking his med. As instructed and last BS was 150.  Advise from PCP if ok to just continue on ??

## 2014-05-20 NOTE — Telephone Encounter (Signed)
I think ok to watch for now, take med as prescribed, and continue to monitor the BS at least daily (either before bfast or before dinnertime), and call with results on Tues jan 12 please

## 2014-05-20 NOTE — Telephone Encounter (Signed)
Patients wife informed of PCP instructions. 

## 2014-05-29 ENCOUNTER — Ambulatory Visit (INDEPENDENT_AMBULATORY_CARE_PROVIDER_SITE_OTHER): Payer: 59

## 2014-05-29 DIAGNOSIS — Z23 Encounter for immunization: Secondary | ICD-10-CM

## 2014-06-08 ENCOUNTER — Telehealth: Payer: Self-pay | Admitting: Internal Medicine

## 2014-06-08 ENCOUNTER — Telehealth: Payer: Self-pay | Admitting: *Deleted

## 2014-06-08 NOTE — Telephone Encounter (Signed)
Patient's spouse called stating the patient needs all new testing supplies. Patient now has [place] # [id number] CB# [phone]

## 2014-06-08 NOTE — Telephone Encounter (Signed)
Wife called back she stated that she called [facility] and they will be sending a order for new diabetic supplies. Pt will be using the One touch ultra 2. Inform pt will update chart and once we receive order will fax back to [facility]...Michael Mcmillan

## 2014-06-08 NOTE — Telephone Encounter (Signed)
Called pt to verify which meter pt is needing. Wife states his old meter One touch is not working. Wanting new supplies sent to [facility]. Advice wife that she may need to contact [facility] to have new order sent to md. We can only send rx to her pharmacy...Johny Chess

## 2014-06-11 MED ORDER — ONETOUCH ULTRA SYSTEM W/DEVICE KIT
PACK | Status: DC
Start: 2014-06-11 — End: 2016-04-27

## 2014-06-11 NOTE — Telephone Encounter (Signed)
Patient's spouse called to request new glucose meter. He needs a One Touch Ultra 2. Pt now has [place]. Pt uses [facility].

## 2014-06-11 NOTE — Telephone Encounter (Signed)
Meter sent in 

## 2014-06-11 NOTE — Addendum Note (Signed)
Addended by: Sharon Seller B on: 06/11/2014 09:49 AM   Modules accepted: Orders, Medications

## 2014-06-22 ENCOUNTER — Ambulatory Visit (AMBULATORY_SURGERY_CENTER): Payer: Self-pay | Admitting: *Deleted

## 2014-06-22 VITALS — Ht 65.5 in | Wt 160.2 lb

## 2014-06-22 DIAGNOSIS — Z800 Family history of malignant neoplasm of digestive organs: Secondary | ICD-10-CM

## 2014-06-22 MED ORDER — MOVIPREP 100 G PO SOLR: 1.0000 | Freq: Once | ORAL | Status: DC

## 2014-06-22 NOTE — Progress Notes (Signed)
No egg or soy allergy No diet pills No home 02 use No issues with past sedation Pt declined emmi video Pt's wife at his side in [place]

## 2014-07-02 ENCOUNTER — Encounter: Payer: Self-pay | Admitting: Internal Medicine

## 2014-07-07 ENCOUNTER — Other Ambulatory Visit: Payer: Self-pay

## 2014-07-07 MED ORDER — PANTOPRAZOLE SODIUM 40 MG PO TBEC: 40.0000 mg | DELAYED_RELEASE_TABLET | Freq: Every day | ORAL | Status: DC

## 2014-07-10 ENCOUNTER — Encounter: Payer: Self-pay | Admitting: Internal Medicine

## 2014-07-10 ENCOUNTER — Ambulatory Visit (AMBULATORY_SURGERY_CENTER): Payer: 59 | Admitting: Internal Medicine

## 2014-07-10 VITALS — BP 132/81 | HR 66 | Temp 97.9°F | Resp 17 | Ht 65.0 in | Wt 158.0 lb

## 2014-07-10 DIAGNOSIS — D125 Benign neoplasm of sigmoid colon: Secondary | ICD-10-CM

## 2014-07-10 DIAGNOSIS — Z1211 Encounter for screening for malignant neoplasm of colon: Secondary | ICD-10-CM

## 2014-07-10 DIAGNOSIS — Z800 Family history of malignant neoplasm of digestive organs: Secondary | ICD-10-CM

## 2014-07-10 DIAGNOSIS — K635 Polyp of colon: Secondary | ICD-10-CM

## 2014-07-10 LAB — GLUCOSE, CAPILLARY
GLUCOSE-CAPILLARY: 117 mg/dL — AB (ref 70–99)
Glucose-Capillary: 127 mg/dL — ABNORMAL HIGH (ref 70–99)

## 2014-07-10 MED ORDER — SODIUM CHLORIDE 0.9 % IV SOLN: 500.0000 mL | INTRAVENOUS | Status: DC

## 2014-07-10 NOTE — Patient Instructions (Signed)
Colon polyps removed today, await path. Results. Try to follow high fiber diet. Handouts given on polyps, and high fiber diet. Call us with any questions or concerns. Thank you!  YOU HAD AN ENDOSCOPIC PROCEDURE TODAY AT [facility]: Refer to the procedure report that was given to you for any specific questions about what was found during the examination.  If the procedure report does not answer your questions, please call your gastroenterologist to clarify.  If you requested that your care partner not be given the details of your procedure findings, then the procedure report has been included in a sealed envelope for you to review at your convenience later.  YOU SHOULD EXPECT: Some feelings of bloating in the abdomen. Passage of more gas than usual.  Walking can help get rid of the air that was put into your GI tract during the procedure and reduce the bloating. If you had a lower endoscopy (such as a colonoscopy or flexible sigmoidoscopy) you may notice spotting of blood in your stool or on the toilet paper. If you underwent a bowel prep for your procedure, then you may not have a normal bowel movement for a few days.  DIET: Your first meal following the procedure should be a light meal and then it is ok to progress to your normal diet.  A half-sandwich or bowl of soup is an example of a good first meal.  Heavy or fried foods are harder to digest and may make you feel nauseous or bloated.  Likewise meals heavy in dairy and vegetables can cause extra gas to form and this can also increase the bloating.  Drink plenty of fluids but you should avoid alcoholic beverages for 24 hours.  ACTIVITY: Your care partner should take you home directly after the procedure.  You should plan to take it easy, moving slowly for the rest of the day.  You can resume normal activity the day after the procedure however you should NOT DRIVE or use heavy machinery for 24 hours (because of the sedation medicines  used during the test).    SYMPTOMS TO REPORT IMMEDIATELY: A gastroenterologist can be reached at any hour.  During normal business hours, 8:30 AM to 5:00 PM Monday through Friday, call [phone].  After hours and on weekends, please call the GI answering service at [phone] who will take a message and have the physician on call contact you.   Following lower endoscopy (colonoscopy or flexible sigmoidoscopy):  Excessive amounts of blood in the stool  Significant tenderness or worsening of abdominal pains  Swelling of the abdomen that is new, acute  Fever of 100F or higher  Following upper endoscopy (EGD)  Vomiting of blood or coffee ground material  New chest pain or pain under the shoulder blades  Painful or persistently difficult swallowing  New shortness of breath  Fever of 100F or higher  Black, tarry-looking stools  FOLLOW UP: If any biopsies were taken you will be contacted by phone or by letter within the next 1-3 weeks.  Call your gastroenterologist if you have not heard about the biopsies in 3 weeks.  Our staff will call the home number listed on your records the next business day following your procedure to check on you and address any questions or concerns that you may have at that time regarding the information given to you following your procedure. This is a courtesy call and so if there is no answer at the home number and we  have not heard from you through the emergency physician on call, we will assume that you have returned to your regular daily activities without incident.  SIGNATURES/CONFIDENTIALITY: You and/or your care partner have signed paperwork which will be entered into your electronic medical record.  These signatures attest to the fact that that the information above on your After Visit Summary has been reviewed and is understood.  Full responsibility of the confidentiality of this discharge information lies with you and/or your care-partner.

## 2014-07-10 NOTE — Progress Notes (Signed)
A/ox3 pleased with MAC, report to Robbin RN 

## 2014-07-10 NOTE — Op Note (Signed)
[place]  [address]. [place], [id number]   COLONOSCOPY PROCEDURE REPORT  PATIENT: Michael Mcmillan, Michael Mcmillan  MR#: [id number] BIRTHDATE: 03-09-1951 , 4  yrs. old GENDER: male ENDOSCOPIST: Lafayette Dragon, MD REFERRED BZ:MCEYE John, M.D. PROCEDURE DATE:  07/10/2014 PROCEDURE:   Colonoscopy with cold biopsy polypectomy First Screening Colonoscopy - Avg.  risk and is 50 yrs.  old or older Yes.  Prior Negative Screening - Now for repeat screening. N/A  History of Adenoma - Now for follow-up colonoscopy & has been > or = to 3 yrs.  N/A  Polyps Removed Today? Yes. ASA CLASS:   Class II INDICATIONS:patient's immediate family history of colon cancer. MEDICATIONS: Monitored anesthesia care and Propofol 160 mg IV  DESCRIPTION OF PROCEDURE:   After the risks benefits and alternatives of the procedure were thoroughly explained, informed consent was obtained.  The digital rectal exam revealed no abnormalities of the rectum.   The LB CF-[id number] Loaner [id number] endoscope was introduced through the anus and advanced to the cecum, which was identified by both the appendix and ileocecal valve. No adverse events experienced.   The quality of the prep was good, using MoviPrep  The instrument was then slowly withdrawn as the colon was fully examined.      COLON FINDINGS: Three sessile polyps measuring 6 mm in size were found in the sigmoid colon.  A polypectomy was performed with cold forceps.  The resection was complete, the polyp tissue was completely retrieved and sent to histology.  Retroflexed views revealed no abnormalities. The time to cecum=4 minutes 47 seconds. Withdrawal time=9 minutes 32 seconds.  The scope was withdrawn and the procedure completed. COMPLICATIONS: There were no immediate complications.  ENDOSCOPIC IMPRESSION: Three sessile polyps were found in the sigmoid colon; polypectomy was performed with cold forceps  RECOMMENDATIONS: 1.  Await biopsy results 2.   High-fiber diet Recall colonoscopy pending path report  eSigned:  Lafayette Dragon, MD 07/10/2014 11:16 AM   cc:

## 2014-07-10 NOTE — Progress Notes (Signed)
Called to room to assist during endoscopic procedure.  Patient ID and intended procedure confirmed with present staff. Received instructions for my participation in the procedure from the performing physician.  

## 2014-07-13 ENCOUNTER — Telehealth: Payer: Self-pay | Admitting: *Deleted

## 2014-07-13 NOTE — Telephone Encounter (Signed)
  Follow up Call-  Call back number 07/10/2014  Post procedure Call Back phone  # [phone]  Permission to leave phone message Yes     Patient questions:  Do you have a fever, pain , or abdominal swelling? No. Pain Score  0 *  Have you tolerated food without any problems? Yes.    Have you been able to return to your normal activities? Yes.    Do you have any questions about your discharge instructions: Diet   No. Medications  No. Follow up visit  No.  Do you have questions or concerns about your Care? No.  Actions: * If pain score is 4 or above: No action needed, pain <4.

## 2014-07-14 ENCOUNTER — Encounter: Payer: Self-pay | Admitting: Internal Medicine

## 2014-11-06 ENCOUNTER — Ambulatory Visit: Payer: BC Managed Care – PPO | Admitting: Internal Medicine

## 2014-12-11 ENCOUNTER — Encounter: Payer: Self-pay | Admitting: Internal Medicine

## 2014-12-11 ENCOUNTER — Ambulatory Visit (INDEPENDENT_AMBULATORY_CARE_PROVIDER_SITE_OTHER): Payer: 59 | Admitting: Internal Medicine

## 2014-12-11 ENCOUNTER — Ambulatory Visit (INDEPENDENT_AMBULATORY_CARE_PROVIDER_SITE_OTHER)
Admission: RE | Admit: 2014-12-11 | Discharge: 2014-12-11 | Disposition: A | Discharge status: Home / Self Care | Payer: 59 | Source: Ambulatory Visit | Attending: Internal Medicine | Admitting: Internal Medicine

## 2014-12-11 ENCOUNTER — Other Ambulatory Visit (INDEPENDENT_AMBULATORY_CARE_PROVIDER_SITE_OTHER): Payer: 59

## 2014-12-11 ENCOUNTER — Other Ambulatory Visit: Payer: Self-pay | Admitting: Internal Medicine

## 2014-12-11 VITALS — BP 114/70 | HR 82 | Temp 98.0°F | Ht 65.0 in | Wt 153.0 lb

## 2014-12-11 DIAGNOSIS — R634 Abnormal weight loss: Secondary | ICD-10-CM | POA: Diagnosis not present

## 2014-12-11 DIAGNOSIS — E119 Type 2 diabetes mellitus without complications: Secondary | ICD-10-CM

## 2014-12-11 DIAGNOSIS — M5416 Radiculopathy, lumbar region: Secondary | ICD-10-CM | POA: Insufficient documentation

## 2014-12-11 LAB — HEPATIC FUNCTION PANEL
ALBUMIN: 4 g/dL (ref 3.5–5.2)
ALT: 17 U/L (ref 0–53)
AST: 13 U/L (ref 0–37)
Alkaline Phosphatase: 87 U/L (ref 39–117)
BILIRUBIN DIRECT: 0.1 mg/dL (ref 0.0–0.3)
Total Bilirubin: 0.3 mg/dL (ref 0.2–1.2)
Total Protein: 6.7 g/dL (ref 6.0–8.3)

## 2014-12-11 LAB — TSH: TSH: 0.34 u[IU]/mL — ABNORMAL LOW (ref 0.35–4.50)

## 2014-12-11 LAB — CBC WITH DIFFERENTIAL/PLATELET
Basophils Absolute: 0.1 10*3/uL (ref 0.0–0.1)
Basophils Relative: 1.6 % (ref 0.0–3.0)
EOS PCT: 11.2 % — AB (ref 0.0–5.0)
Eosinophils Absolute: 0.8 10*3/uL — ABNORMAL HIGH (ref 0.0–0.7)
HCT: 46.2 % (ref 39.0–52.0)
HEMOGLOBIN: 15.5 g/dL (ref 13.0–17.0)
LYMPHS ABS: 2.1 10*3/uL (ref 0.7–4.0)
LYMPHS PCT: 28.9 % (ref 12.0–46.0)
MCHC: 33.5 g/dL (ref 30.0–36.0)
MCV: 90 fl (ref 78.0–100.0)
MONOS PCT: 8 % (ref 3.0–12.0)
Monocytes Absolute: 0.6 10*3/uL (ref 0.1–1.0)
NEUTROS ABS: 3.6 10*3/uL (ref 1.4–7.7)
NEUTROS PCT: 50.3 % (ref 43.0–77.0)
Platelets: 196 10*3/uL (ref 150.0–400.0)
RBC: 5.14 Mil/uL (ref 4.22–5.81)
RDW: 14 % (ref 11.5–15.5)
WBC: 7.2 10*3/uL (ref 4.0–10.5)

## 2014-12-11 LAB — LIPID PANEL
CHOL/HDL RATIO: 3
CHOLESTEROL: 148 mg/dL (ref 0–200)
HDL: 45.4 mg/dL (ref >39.00)
LDL Cholesterol: 87 mg/dL (ref 0–99)
NonHDL: 103.03
TRIGLYCERIDES: 82 mg/dL (ref 0.0–149.0)
VLDL: 16.4 mg/dL (ref 0.0–40.0)

## 2014-12-11 LAB — URINALYSIS, ROUTINE W REFLEX MICROSCOPIC
Bilirubin Urine: NEGATIVE
Hgb urine dipstick: NEGATIVE
KETONES UR: NEGATIVE
LEUKOCYTES UA: NEGATIVE
Nitrite: NEGATIVE
PH: 6 (ref 5.0–8.0)
Specific Gravity, Urine: 1.025 (ref 1.000–1.030)
TOTAL PROTEIN, URINE-UPE24: NEGATIVE
Urobilinogen, UA: 0.2 (ref 0.0–1.0)

## 2014-12-11 LAB — BASIC METABOLIC PANEL
BUN: 19 mg/dL (ref 6–23)
CO2: 24 meq/L (ref 19–32)
Calcium: 9.1 mg/dL (ref 8.4–10.5)
Chloride: 108 mEq/L (ref 96–112)
Creatinine, Ser: 0.91 mg/dL (ref 0.40–1.50)
GFR: 107.95 mL/min (ref >60.00)
GLUCOSE: 238 mg/dL — AB (ref 70–99)
POTASSIUM: 4.5 meq/L (ref 3.5–5.1)
Sodium: 139 mEq/L (ref 135–145)

## 2014-12-11 LAB — HEMOGLOBIN A1C: HEMOGLOBIN A1C: 7.9 % — AB (ref 4.6–6.5)

## 2014-12-11 MED ORDER — METFORMIN HCL ER 500 MG PO TB24: 500.0000 mg | ORAL_TABLET | Freq: Every day | ORAL | Status: DC

## 2014-12-11 MED ORDER — KETOROLAC TROMETHAMINE 30 MG/ML IM SOLN
30.0000 mg | Freq: Once | INTRAMUSCULAR | Status: AC
  Administered 2014-12-11: 30 mg via INTRAMUSCULAR

## 2014-12-11 MED ORDER — CELECOXIB 200 MG PO CAPS
200.0000 mg | ORAL_CAPSULE | Freq: Two times a day (BID) | ORAL | Status: DC | PRN
Start: 2014-12-11 — End: 2016-04-27

## 2014-12-11 NOTE — Assessment & Plan Note (Addendum)
May have mild RLE involvement as well, for toradol IM today, celebrex bid prn, also MRI LS spine, and letter given for [facility] purposes, declines other orthopedic eval for now   Note:  Total time for pt hx, exam, review of record with pt in the room, determination of diagnoses and plan for further eval and tx is > 40 min, with over 50% spent in coordination and counseling of patient

## 2014-12-11 NOTE — Assessment & Plan Note (Signed)
Unclear etiology, only 7 lbs but I wonder if related to uncontrolled DM, for a1c today, also spep, and labs as documented, and cxr
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
## 2014-12-11 NOTE — Patient Instructions (Addendum)
Please take all new medication as prescribed  - the celebrex for pain  You had the pain shot today (toradol)  You will be contacted regarding the referral for: MRI for the lower back  Please continue all other medications as before, and refills have been done if requested.  Please have the pharmacy call with any other refills you may need.  Please continue your efforts at being more active, low cholesterol diet, and weight control.  Please keep your appointments with your specialists as you may have planned  Please go to the XRAY Department in the Basement (go straight as you get off the elevator) for the x-ray testing  Please go to the LAB in the Basement (turn left off the elevator) for the tests to be done today  You will be contacted by phone if any changes need to be made immediately.  Otherwise, you will receive a letter about your results with an explanation, but please check with MyChart first.  Please remember to sign up for MyChart if you have not done so, as this will be important to you in the future with finding out test results, communicating by private email, and scheduling acute appointments online when needed.  Please return in 6 months, or sooner if needed  You are given the letter for VA purposes today

## 2014-12-11 NOTE — Assessment & Plan Note (Signed)
Recent uncontrolled, but o/w stable overall by history and exam except for recent wt loss, recent data reviewed with pt, and pt to continue medical treatment as before,  to f/u any worsening symptoms or concerns, for f/u lab today Lab Results  Component Value Date   HGBA1C 8.4* 05/07/2014

## 2014-12-11 NOTE — Addendum Note (Signed)
Addended by: Lyman Bishop on: 12/11/2014 11:49 AM   Modules accepted: Orders

## 2014-12-11 NOTE — Progress Notes (Signed)
Pre visit review using our clinic review tool, if applicable. No additional management support is needed unless otherwise documented below in the visit note. 

## 2014-12-15 ENCOUNTER — Telehealth: Payer: Self-pay | Admitting: Internal Medicine

## 2014-12-15 LAB — PROTEIN ELECTROPHORESIS, SERUM
ALPHA-1-GLOBULIN: 0.3 g/dL (ref 0.2–0.3)
ALPHA-2-GLOBULIN: 0.9 g/dL (ref 0.5–0.9)
Albumin ELP: 4.1 g/dL (ref 3.8–4.8)
Beta 2: 0.3 g/dL (ref 0.2–0.5)
Beta Globulin: 0.5 g/dL (ref 0.4–0.6)
Gamma Globulin: 0.9 g/dL (ref 0.8–1.7)
Total Protein, Serum Electrophoresis: 7 g/dL (ref 6.1–8.1)

## 2014-12-15 MED ORDER — PIOGLITAZONE HCL 15 MG PO TABS: 15.0000 mg | ORAL_TABLET | Freq: Every day | ORAL | Status: DC

## 2014-12-15 NOTE — Telephone Encounter (Signed)
Ok to hold on taking the metformin  OK to take actos 15 mg per day  Continue glimeparide as he has  Cont all other meds, thanks

## 2014-12-15 NOTE — Telephone Encounter (Signed)
-----   Message from Lyman Bishop, [place] sent at 12/15/2014 10:08 AM EDT ----- Pt advised but states that Metformin causes diarrhea, pt is requesting alternative medication, please advise

## 2014-12-15 NOTE — Telephone Encounter (Signed)
Pt's spouse advised 

## 2015-01-01 ENCOUNTER — Other Ambulatory Visit: Payer: 59

## 2015-05-31 ENCOUNTER — Telehealth: Payer: Self-pay

## 2015-05-31 ENCOUNTER — Other Ambulatory Visit (INDEPENDENT_AMBULATORY_CARE_PROVIDER_SITE_OTHER): Payer: Self-pay

## 2015-05-31 ENCOUNTER — Telehealth: Payer: Self-pay | Admitting: Internal Medicine

## 2015-05-31 DIAGNOSIS — E109 Type 1 diabetes mellitus without complications: Secondary | ICD-10-CM

## 2015-05-31 LAB — HEMOGLOBIN A1C: Hgb A1c MFr Bld: 8.9 % — ABNORMAL HIGH (ref 4.6–6.5)

## 2015-05-31 NOTE — Telephone Encounter (Signed)
Please call Vaughan Basta (sister in-law) back at [phone]

## 2015-05-31 NOTE — Telephone Encounter (Signed)
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

## 2015-05-31 NOTE — Telephone Encounter (Signed)
Pt called stated he need an A1C order today if possible. Please call him back

## 2015-05-31 NOTE — Telephone Encounter (Signed)
a1c is done  Please ask pt to make OV to discuss further treatment

## 2015-06-01 NOTE — Telephone Encounter (Signed)
Left massage for pt to call back and make an appt with Dr. Jenny Reichmann

## 2015-06-02 ENCOUNTER — Ambulatory Visit: Payer: Self-pay | Admitting: Internal Medicine

## 2015-06-03 ENCOUNTER — Telehealth: Payer: Self-pay | Admitting: Internal Medicine

## 2015-06-03 NOTE — Telephone Encounter (Signed)
Labs mailed, pt advised to make ROV to discuss

## 2015-06-03 NOTE — Telephone Encounter (Signed)
Pt is requesting that the results of his A1C to be mailed to him at current address [place] [address]. Please call pt if results can't be mailed. MS

## 2015-06-18 ENCOUNTER — Ambulatory Visit: Payer: 59 | Admitting: Internal Medicine

## 2016-04-27 ENCOUNTER — Ambulatory Visit (INDEPENDENT_AMBULATORY_CARE_PROVIDER_SITE_OTHER): Payer: Commercial Managed Care - HMO | Admitting: Internal Medicine

## 2016-04-27 ENCOUNTER — Telehealth: Payer: Self-pay | Admitting: *Deleted

## 2016-04-27 ENCOUNTER — Other Ambulatory Visit (INDEPENDENT_AMBULATORY_CARE_PROVIDER_SITE_OTHER): Payer: Commercial Managed Care - HMO

## 2016-04-27 VITALS — BP 120/64 | HR 90 | Temp 97.9°F | Resp 16 | Ht 65.0 in | Wt 167.0 lb

## 2016-04-27 DIAGNOSIS — K219 Gastro-esophageal reflux disease without esophagitis: Secondary | ICD-10-CM | POA: Diagnosis not present

## 2016-04-27 DIAGNOSIS — N400 Enlarged prostate without lower urinary tract symptoms: Secondary | ICD-10-CM | POA: Insufficient documentation

## 2016-04-27 DIAGNOSIS — Z23 Encounter for immunization: Secondary | ICD-10-CM

## 2016-04-27 DIAGNOSIS — Z1159 Encounter for screening for other viral diseases: Secondary | ICD-10-CM

## 2016-04-27 DIAGNOSIS — F32A Depression, unspecified: Secondary | ICD-10-CM | POA: Insufficient documentation

## 2016-04-27 DIAGNOSIS — M5412 Radiculopathy, cervical region: Secondary | ICD-10-CM

## 2016-04-27 DIAGNOSIS — E119 Type 2 diabetes mellitus without complications: Secondary | ICD-10-CM | POA: Diagnosis not present

## 2016-04-27 DIAGNOSIS — Z0001 Encounter for general adult medical examination with abnormal findings: Secondary | ICD-10-CM

## 2016-04-27 DIAGNOSIS — F329 Major depressive disorder, single episode, unspecified: Secondary | ICD-10-CM

## 2016-04-27 LAB — MICROALBUMIN / CREATININE URINE RATIO
Creatinine,U: 108.9 mg/dL
MICROALB UR: 0.7 mg/dL (ref 0.0–1.9)
MICROALB/CREAT RATIO: 0.6 mg/g (ref 0.0–30.0)

## 2016-04-27 LAB — URINALYSIS, ROUTINE W REFLEX MICROSCOPIC
Bilirubin Urine: NEGATIVE
Hgb urine dipstick: NEGATIVE
KETONES UR: NEGATIVE
Leukocytes, UA: NEGATIVE
NITRITE: NEGATIVE
PH: 6 (ref 5.0–8.0)
RBC / HPF: NONE SEEN (ref 0–?)
SPECIFIC GRAVITY, URINE: 1.01 (ref 1.000–1.030)
TOTAL PROTEIN, URINE-UPE24: NEGATIVE
Urobilinogen, UA: 0.2 (ref 0.0–1.0)

## 2016-04-27 LAB — LIPID PANEL
CHOLESTEROL: 156 mg/dL (ref 0–200)
HDL: 39.4 mg/dL (ref >39.00)
LDL Cholesterol: 93 mg/dL (ref 0–99)
NonHDL: 116.13
TRIGLYCERIDES: 116 mg/dL (ref 0.0–149.0)
Total CHOL/HDL Ratio: 4
VLDL: 23.2 mg/dL (ref 0.0–40.0)

## 2016-04-27 LAB — TSH: TSH: 0.14 u[IU]/mL — AB (ref 0.35–4.50)

## 2016-04-27 LAB — BASIC METABOLIC PANEL
BUN: 19 mg/dL (ref 6–23)
CHLORIDE: 101 meq/L (ref 96–112)
CO2: 29 mEq/L (ref 19–32)
Calcium: 9.5 mg/dL (ref 8.4–10.5)
Creatinine, Ser: 1.11 mg/dL (ref 0.40–1.50)
GFR: 85.46 mL/min (ref >60.00)
GLUCOSE: 374 mg/dL — AB (ref 70–99)
POTASSIUM: 4.7 meq/L (ref 3.5–5.1)
Sodium: 137 mEq/L (ref 135–145)

## 2016-04-27 LAB — HEPATIC FUNCTION PANEL
ALBUMIN: 4.3 g/dL (ref 3.5–5.2)
ALT: 15 U/L (ref 0–53)
AST: 13 U/L (ref 0–37)
Alkaline Phosphatase: 87 U/L (ref 39–117)
BILIRUBIN TOTAL: 0.5 mg/dL (ref 0.2–1.2)
Bilirubin, Direct: 0.1 mg/dL (ref 0.0–0.3)
Total Protein: 6.8 g/dL (ref 6.0–8.3)

## 2016-04-27 LAB — PSA: PSA: 6.44 ng/mL — ABNORMAL HIGH (ref 0.10–4.00)

## 2016-04-27 LAB — HEMOGLOBIN A1C

## 2016-04-27 MED ORDER — GLUCOSE BLOOD VI STRP: 1.0000 | ORAL_STRIP | Freq: Two times a day (BID) | 11 refills | Status: AC

## 2016-04-27 MED ORDER — ONETOUCH ULTRA SYSTEM W/DEVICE KIT: PACK | 0 refills | Status: DC

## 2016-04-27 MED ORDER — PIOGLITAZONE HCL 45 MG PO TABS: 45.0000 mg | ORAL_TABLET | Freq: Every day | ORAL | 3 refills | Status: DC

## 2016-04-27 MED ORDER — OMEPRAZOLE 20 MG PO CPDR: 20.0000 mg | DELAYED_RELEASE_CAPSULE | Freq: Every day | ORAL | 3 refills | Status: DC

## 2016-04-27 MED ORDER — SERTRALINE HCL 100 MG PO TABS: 100.0000 mg | ORAL_TABLET | Freq: Every day | ORAL | 3 refills | Status: DC

## 2016-04-27 MED ORDER — ONETOUCH LANCETS MISC: 11 refills | Status: AC

## 2016-04-27 MED ORDER — GLIPIZIDE ER 10 MG PO TB24: 10.0000 mg | ORAL_TABLET | Freq: Every day | ORAL | 3 refills | Status: DC

## 2016-04-27 MED ORDER — ACCU-CHEK AVIVA PLUS W/DEVICE KIT
PACK | 0 refills | Status: AC
Start: 2016-04-27 — End: ?

## 2016-04-27 MED ORDER — GLUCOSE BLOOD VI STRP: 1.0000 | ORAL_STRIP | Freq: Two times a day (BID) | 3 refills | Status: AC

## 2016-04-27 MED ORDER — TAMSULOSIN HCL 0.4 MG PO CAPS: 0.4000 mg | ORAL_CAPSULE | Freq: Every day | ORAL | 3 refills | Status: DC

## 2016-04-27 MED ORDER — CELECOXIB 200 MG PO CAPS: 200.0000 mg | ORAL_CAPSULE | Freq: Two times a day (BID) | ORAL | 11 refills | Status: DC | PRN

## 2016-04-27 MED ORDER — ACCU-CHEK SOFTCLIX LANCETS MISC: 3 refills | Status: AC

## 2016-04-27 MED ORDER — CELECOXIB 200 MG PO CAPS: 200.0000 mg | ORAL_CAPSULE | Freq: Two times a day (BID) | ORAL | 3 refills | Status: DC | PRN

## 2016-04-27 NOTE — Progress Notes (Signed)
Pre visit review using our clinic review tool, if applicable. No additional management support is needed unless otherwise documented below in the visit note. 

## 2016-04-27 NOTE — Progress Notes (Signed)
Subjective:    Patient ID: Michael Mcmillan, male    DOB: November 14, 1950, 65 y.o.   MRN: [id number]  HPI  Here for wellness and f/u;  Overall doing ok;  Pt denies Chest pain, worsening SOB, DOE, wheezing, orthopnea, PND, worsening LE edema, palpitations, dizziness or syncope.  Pt denies neurological change such as new headache, facial or extremity weakness. Pt states overall good compliance with treatment and medications, good tolerability, and has been trying to follow appropriate diet.  Pt denies worsening depressive symptoms, suicidal ideation or panic. No fever, night sweats, wt loss, loss of appetite, or other constitutional symptoms.  Pt states good ability with ADL's, has low fall risk, home safety reviewed and adequate, no other significant changes in hearing or vision, and only occasionally active with exercise.  Also with left arm pain for several months, mod to severe, constant, seems to start at the neck, worse to lay on left side, and nothing really makes better. Pt without bowel or bladder change, fever, wt loss, gait change or falls, but has had at least mild LUE numbness and weakness.  Also has slower flow and diffiult to start urination for about 1 yr.   + polydipsia and polyuria, needs new meter, not checking sugars at home.  Has been getting meds from [place] but has not had good control and needs to come here for care now.  Pt denies no low sugar symptoms such as weakness or confusion improved with po intake.  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 Has been taking glipizde 10 qd and actos 30 only.   Past Medical History:  Diagnosis Date  . Diabetes mellitus without complication   . Erectile dysfunction   . GERD (gastroesophageal reflux disease)   . Hypogonadism in male    Past Surgical History:  Procedure Laterality Date   . right arm torn ligaments Right approx 1985  . TONSILLECTOMY      reports that he has quit smoking. He has quit using smokeless tobacco. He reports that he does not drink alcohol or use drugs. family history includes Bone cancer in his mother; Colon cancer in his father. Allergies  Allergen Reactions  . Metformin And Related Diarrhea   Current Outpatient Prescriptions on File Prior to Visit  Medication Sig Dispense Refill  . aspirin EC 81 MG tablet Take 1 tablet (81 mg total) by mouth daily. 90 tablet 11   No current facility-administered medications on file prior to visit.    Review of Systems Constitutional: Negative for increased diaphoresis, or other activity, appetite or siginficant weight change other than noted HENT: Negative for worsening hearing loss, ear pain, facial swelling, mouth sores and neck stiffness.   Eyes: Negative for other worsening pain, redness or visual disturbance.  Respiratory: Negative for choking or stridor Cardiovascular: Negative for other chest pain and palpitations.  Gastrointestinal: Negative for worsening diarrhea, blood in stool, or abdominal distention Genitourinary: Negative for hematuria, flank pain or change in urine volume.  Musculoskeletal: Negative for myalgias or other joint complaints.  Skin: Negative for other color change and wound or drainage.  Neurological: Negative for syncope and numbness. other than noted Hematological: Negative for adenopathy. or other swelling Psychiatric/Behavioral: Negative for hallucinations, SI, self-injury, decreased concentration or other worsening agitation.  All other system neg per pt  Objective:   Physical Exam BP 120/64   Pulse 90   Temp 97.9 F (36.6 C) (Oral)   Resp 16   Ht 5\' 5"  (1.651 m)   Wt 167 lb (75.8 kg)   SpO2 98%   BMI 27.79 kg/m  VS noted,  Constitutional: Pt is oriented to person, place, and time. Appears well-developed and well-nourished, in no significant distress Head:  Normocephalic and atraumatic  Eyes: Conjunctivae and EOM are normal. Pupils are equal, round, and reactive to light Right Ear: External ear normal.  Left Ear: External ear normal Nose: Nose normal.  Mouth/Throat: Oropharynx is clear and moist  Neck: Normal range of motion. Neck supple. No JVD present. No tracheal deviation present or significant neck LA or mass Cardiovascular: Normal rate, regular rhythm, normal heart sounds and intact distal pulses.   Pulmonary/Chest: Effort normal and breath sounds without rales or wheezing  Abdominal: Soft. Bowel sounds are normal. NT. No HSM  Musculoskeletal: Normal range of motion. Exhibits no edema Lymphadenopathy: Has no cervical adenopathy.  Neurological: Pt is alert and oriented to person, place, and time. Pt has normal reflexes. No cranial nerve deficit. Motor 5/5 intact except for LUE with 4+/5 weakness Skin: Skin is warm and dry. No rash noted or new ulcers Psychiatric:  Has depressed mood and affect. Behavior is normal.  No other new exam findings    Assessment & Plan:

## 2016-04-27 NOTE — Patient Instructions (Addendum)
You had the flu shot today, and Pneumovax pneumonia shots  Ok to change the glipizide to the glipizide ER 10 mg per day  OK to increase the generic actos to 45 mg per day  Please take all new medication as prescribed - the zoloft 100 mg per day for depression, and the flomax - 1 per day to help the prostate  Please continue all other medications as before, and refills have been done if requested.  Please have the pharmacy call with any other refills you may need.  Please continue your efforts at being more active, low cholesterol diet, and weight control.  You are otherwise up to date with prevention measures today.  Please keep your appointments with your specialists as you may have planned  You will be contacted regarding the referral for: MRI for the neck, and orthopedic referral  Please go to the LAB in the Basement (turn left off the elevator) for the tests to be done today  You will be contacted by phone if any changes need to be made immediately.  Otherwise, you will receive a letter about your results with an explanation, but please check with MyChart first.  Please remember to sign up for MyChart if you have not done so, as this will be important to you in the future with finding out test results, communicating by private email, and scheduling acute appointments online when needed.  Please return in 3 months, or sooner if needed, with Lab testing done 3-5 days before

## 2016-04-27 NOTE — Telephone Encounter (Signed)
Rec'd call frpm pt he states the one touch monitor/strips are not covered by plan. Need to send in accu chek meter w/supplies to [facility]. Inform pt will fax to [facility]...Michael Mcmillan

## 2016-04-28 LAB — CBC WITH DIFFERENTIAL/PLATELET
BASOS PCT: 0.9 % (ref 0.0–3.0)
Basophils Absolute: 0.1 10*3/uL (ref 0.0–0.1)
EOS ABS: 0.5 10*3/uL (ref 0.0–0.7)
Eosinophils Relative: 5.8 % — ABNORMAL HIGH (ref 0.0–5.0)
HCT: 45.1 % (ref 39.0–52.0)
HEMOGLOBIN: 15.3 g/dL (ref 13.0–17.0)
LYMPHS ABS: 2.1 10*3/uL (ref 0.7–4.0)
Lymphocytes Relative: 23.8 % (ref 12.0–46.0)
MCHC: 33.9 g/dL (ref 30.0–36.0)
MCV: 88.9 fl (ref 78.0–100.0)
MONO ABS: 0.9 10*3/uL (ref 0.1–1.0)
Monocytes Relative: 10.3 % (ref 3.0–12.0)
NEUTROS PCT: 59.2 % (ref 43.0–77.0)
Neutro Abs: 5.2 10*3/uL (ref 1.4–7.7)
Platelets: 211 10*3/uL (ref 150.0–400.0)
RBC: 5.07 Mil/uL (ref 4.22–5.81)
RDW: 13.1 % (ref 11.5–15.5)
WBC: 8.7 10*3/uL (ref 4.0–10.5)

## 2016-04-28 LAB — HEPATITIS C ANTIBODY: HCV Ab: NEGATIVE

## 2016-04-29 NOTE — Assessment & Plan Note (Signed)
Mild to mod, for flomax start,,  to f/u any worsening symptoms or concerns

## 2016-04-29 NOTE — Assessment & Plan Note (Signed)
Mild to mod, denies SI or HI, for zoloft asd,  to f/u any worsening symptoms or concerns

## 2016-04-29 NOTE — Assessment & Plan Note (Signed)
Mild to mod, for pain control, MRI, orthopedic referral,  to f/u any worsening symptoms or concerns  In addition to the time spent performing CPE, I spent an additional 25 minutes face to face,in which greater than 50% of this time was spent in counseling and coordination of care for patient's acute illness as documented.

## 2016-04-29 NOTE — Assessment & Plan Note (Signed)
Stable on current med tx, Mild to mod, for lab f/u today,  to f/u any worsening symptoms or concerns

## 2016-04-29 NOTE — Assessment & Plan Note (Signed)
Mild to mod, for PPI,  to f/u any worsening symptoms or concerns 

## 2016-04-29 NOTE — Assessment & Plan Note (Signed)

## 2016-05-01 ENCOUNTER — Other Ambulatory Visit: Payer: Self-pay | Admitting: Internal Medicine

## 2016-05-01 DIAGNOSIS — M5412 Radiculopathy, cervical region: Secondary | ICD-10-CM

## 2016-05-04 ENCOUNTER — Other Ambulatory Visit: Payer: Self-pay | Admitting: Internal Medicine

## 2016-05-04 ENCOUNTER — Encounter: Payer: Self-pay | Admitting: Internal Medicine

## 2016-05-04 DIAGNOSIS — R972 Elevated prostate specific antigen [PSA]: Secondary | ICD-10-CM

## 2016-05-04 DIAGNOSIS — E0590 Thyrotoxicosis, unspecified without thyrotoxic crisis or storm: Secondary | ICD-10-CM

## 2016-05-06 ENCOUNTER — Inpatient Hospital Stay: Admission: RE | Admit: 2016-05-06 | Payer: Commercial Managed Care - HMO | Source: Ambulatory Visit

## 2016-05-09 ENCOUNTER — Encounter: Payer: Self-pay | Admitting: Internal Medicine

## 2016-05-09 ENCOUNTER — Ambulatory Visit (INDEPENDENT_AMBULATORY_CARE_PROVIDER_SITE_OTHER): Payer: Commercial Managed Care - HMO | Admitting: Internal Medicine

## 2016-05-09 VITALS — BP 130/80 | HR 98 | Temp 98.3°F | Resp 20 | Wt 162.0 lb

## 2016-05-09 DIAGNOSIS — E0590 Thyrotoxicosis, unspecified without thyrotoxic crisis or storm: Secondary | ICD-10-CM

## 2016-05-09 DIAGNOSIS — E1310 Other specified diabetes mellitus with ketoacidosis without coma: Secondary | ICD-10-CM

## 2016-05-09 DIAGNOSIS — M5412 Radiculopathy, cervical region: Secondary | ICD-10-CM

## 2016-05-09 DIAGNOSIS — N400 Enlarged prostate without lower urinary tract symptoms: Secondary | ICD-10-CM

## 2016-05-09 DIAGNOSIS — F32A Depression, unspecified: Secondary | ICD-10-CM

## 2016-05-09 DIAGNOSIS — F329 Major depressive disorder, single episode, unspecified: Secondary | ICD-10-CM

## 2016-05-09 DIAGNOSIS — E119 Type 2 diabetes mellitus without complications: Secondary | ICD-10-CM

## 2016-05-09 DIAGNOSIS — R972 Elevated prostate specific antigen [PSA]: Secondary | ICD-10-CM | POA: Insufficient documentation

## 2016-05-09 DIAGNOSIS — K219 Gastro-esophageal reflux disease without esophagitis: Secondary | ICD-10-CM

## 2016-05-09 DIAGNOSIS — E1110 Type 2 diabetes mellitus with ketoacidosis without coma: Secondary | ICD-10-CM | POA: Insufficient documentation

## 2016-05-09 DIAGNOSIS — R52 Pain, unspecified: Secondary | ICD-10-CM

## 2016-05-09 MED ORDER — GLIPIZIDE ER 10 MG PO TB24: 10.0000 mg | ORAL_TABLET | Freq: Every day | ORAL | 3 refills | Status: DC

## 2016-05-09 MED ORDER — SERTRALINE HCL 100 MG PO TABS: 100.0000 mg | ORAL_TABLET | Freq: Every day | ORAL | 3 refills | Status: DC

## 2016-05-09 MED ORDER — OMEPRAZOLE 20 MG PO CPDR: 20.0000 mg | DELAYED_RELEASE_CAPSULE | Freq: Every day | ORAL | 3 refills | Status: DC

## 2016-05-09 MED ORDER — DOXYCYCLINE HYCLATE 100 MG PO TABS: 100.0000 mg | ORAL_TABLET | Freq: Two times a day (BID) | ORAL | 0 refills | Status: DC

## 2016-05-09 MED ORDER — CELECOXIB 200 MG PO CAPS: 200.0000 mg | ORAL_CAPSULE | Freq: Two times a day (BID) | ORAL | 3 refills | Status: DC | PRN

## 2016-05-09 MED ORDER — EXENATIDE ER 2 MG/0.85ML ~~LOC~~ AUIJ: 2.0000 mg | AUTO-INJECTOR | SUBCUTANEOUS | 3 refills | Status: DC

## 2016-05-09 MED ORDER — PIOGLITAZONE HCL 45 MG PO TABS: 45.0000 mg | ORAL_TABLET | Freq: Every day | ORAL | 3 refills | Status: DC

## 2016-05-09 MED ORDER — TAMSULOSIN HCL 0.4 MG PO CAPS: 0.4000 mg | ORAL_CAPSULE | Freq: Every day | ORAL | 3 refills | Status: DC

## 2016-05-09 NOTE — Assessment & Plan Note (Signed)
New onset, for endocrinology referral,  to f/u any worsening symptoms or concerns

## 2016-05-09 NOTE — Addendum Note (Signed)
Addended by: Biagio Borg on: 05/09/2016 10:49 AM   Modules accepted: Orders

## 2016-05-09 NOTE — Assessment & Plan Note (Signed)
stable overall by history and exam, though jittiriness worse possibly due to thyroid, and pt to continue medical treatment as before,  to f/u any worsening symptoms or concernsd

## 2016-05-09 NOTE — Assessment & Plan Note (Signed)
stable overall by history and exam but uncontrolled by a1c, recent data reviewed with pt, and pt to start bydureon weekly, cont other oral meds, refer DM education,  to f/u any worsening symptoms or concerns, f/u also at 3 mo Lab Results  Component Value Date   HGBA1C 10.1 Repeated and verified X2. (H) 04/27/2016

## 2016-05-09 NOTE — Patient Instructions (Addendum)
Please take all new medication as prescribed - the antibiotic sent to [facility]  Please take all new medication as prescribed - the Bydureon sent to [place]  Please return to the LAB only in 4 weeks for PSA re-check  Please continue all other medications as before   Please have the pharmacy call with any other refills you may need.  Please continue your efforts at being more active, low cholesterol diet, and weight control.  You will be contacted regarding the referral for: Endocrinology for thyroid, and Urology for elevated PSA, as well as the Diabetes Education classes  Please keep your appointments with your specialists as you may have planned  Please call to re-schedule your MRI as you mentioned  Please return in 3 months, or sooner if needed

## 2016-05-09 NOTE — Assessment & Plan Note (Signed)
Ok to start the flomax as prescribed,  to f/u any worsening symptoms or concerns

## 2016-05-09 NOTE — Progress Notes (Signed)
Pre visit review using our clinic review tool, if applicable. No additional management support is needed unless otherwise documented below in the visit note. 

## 2016-05-09 NOTE — Assessment & Plan Note (Signed)
Asympt except for mild prostatism, afeb, for doxy course x 4 wks with repeat PSA, also refer urology

## 2016-05-09 NOTE — Progress Notes (Signed)
Subjective:    Patient ID: Michael Mcmillan, male    DOB: 1950/12/19, 65 y.o.   MRN: [id number]  HPIn here to f/u DM and other lab abnormality from last visit today with wife; overall doing ok, has not yet started the flomax but Denies urinary symptoms such as dysuria, frequency, urgency, flank pain, hematuria or n/v, fever, chills, though still has low flow.  Wife states they cancelled the initial MRI due to cost, but today pt states has changed his mind and will have the test rescheduled as the symptoms are not improved.  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  Has been jittery and nervous recently. Past Medical History:  Diagnosis Date  . Diabetes mellitus without complication ([place])   . Erectile dysfunction   . GERD (gastroesophageal reflux disease)   . Hypogonadism in male    Past Surgical History:  Procedure Laterality Date  . right arm torn ligaments Right approx 1985  . TONSILLECTOMY      reports that he has quit smoking. He has quit using smokeless tobacco. He reports that he does not drink alcohol or use drugs. family history includes Bone cancer in his mother; Colon cancer in his father. Allergies  Allergen Reactions  . Metformin And Related Diarrhea   Current Outpatient Prescriptions on File Prior to Visit  Medication Sig Dispense Refill  . ACCU-CHEK SOFTCLIX LANCETS lancets Use to help check blood sugars twice a day Dx E11.9 300 each 3  . aspirin EC 81 MG tablet Take 1 tablet (81 mg total) by mouth daily. 90 tablet 11  . Blood Glucose Monitoring Suppl (ACCU-CHEK AVIVA PLUS) w/Device KIT Use to check blood sugars twice a day. Dx E11.9 1 kit 0  . glucose blood (ACCU-CHEK AVIVA PLUS) test strip 1 each by Other route 2 (two) times daily. Use to check blood sugars twice a day Dx  E11.9 300 each 3  . glucose blood test strip 1 each by Other route 2 (two) times daily. Use as instructed 200 each 11  . ONE TOUCH LANCETS MISC Use as directed twice per day 200 each 11   No current facility-administered medications on file prior to visit.    Review of Systems  Constitutional: Negative for unusual diaphoresis or night sweats HENT: Negative for ear swelling or discharge Eyes: Negative for worsening visual haziness  Respiratory: Negative for choking and stridor.   Gastrointestinal: Negative for distension or worsening eructation Genitourinary: Negative for retention or change in urine volume.  Musculoskeletal: Negative for other MSK pain or swelling Skin: Negative for color change and worsening wound Neurological: Negative for tremors and numbness other than noted  Psychiatric/Behavioral: Negative for decreased concentration or agitation other than above   All other system neg per pt    Objective:   Physical Exam BP 130/80   Pulse 98   Temp 98.3 F (36.8 C) (Oral)   Resp 20   Wt 162 lb (73.5 kg)   SpO2 95%   BMI 26.96 kg/m  VS noted,  Constitutional: Pt appears in no apparent distress HENT: Head: NCAT.  Right Ear: External ear normal.  Left Ear: External ear normal.  Eyes: . Pupils are equal, round, and reactive to light. Conjunctivae and EOM are normal Neck: Normal range of motion.  Neck supple.  Cardiovascular: Normal rate and regular rhythm.   Pulmonary/Chest: Effort normal and breath sounds without rales or wheezing.  Abd:  Soft, NT, ND, + BS Neurological: Pt is alert. Not confused , cn 2-12 intact Skin: Skin is warm. No rash, no LE edema Psychiatric: Pt behavior is normal. No agitation. mild nervous No other new exam findings    Assessment & Plan:

## 2016-05-25 ENCOUNTER — Encounter: Payer: Self-pay | Admitting: Internal Medicine

## 2016-06-01 ENCOUNTER — Ambulatory Visit: Payer: Commercial Managed Care - HMO | Admitting: Endocrinology

## 2016-06-12 ENCOUNTER — Ambulatory Visit: Payer: Commercial Managed Care - HMO | Admitting: Endocrinology

## 2016-06-26 ENCOUNTER — Ambulatory Visit
Admission: RE | Admit: 2016-06-26 | Discharge: 2016-06-26 | Disposition: A | Discharge status: Home / Self Care | Payer: Commercial Managed Care - HMO | Source: Ambulatory Visit | Attending: Internal Medicine | Admitting: Internal Medicine

## 2016-06-26 ENCOUNTER — Other Ambulatory Visit: Payer: Commercial Managed Care - HMO

## 2016-06-26 DIAGNOSIS — M5412 Radiculopathy, cervical region: Secondary | ICD-10-CM

## 2016-06-26 DIAGNOSIS — M50222 Other cervical disc displacement at C5-C6 level: Secondary | ICD-10-CM | POA: Diagnosis not present

## 2016-06-26 DIAGNOSIS — M50221 Other cervical disc displacement at C4-C5 level: Secondary | ICD-10-CM | POA: Diagnosis not present

## 2016-07-03 ENCOUNTER — Other Ambulatory Visit: Payer: Self-pay | Admitting: Internal Medicine

## 2016-07-03 DIAGNOSIS — M5412 Radiculopathy, cervical region: Secondary | ICD-10-CM

## 2016-07-05 ENCOUNTER — Encounter: Payer: Self-pay | Admitting: Internal Medicine

## 2016-07-10 DIAGNOSIS — M75102 Unspecified rotator cuff tear or rupture of left shoulder, not specified as traumatic: Secondary | ICD-10-CM | POA: Diagnosis not present

## 2016-07-10 DIAGNOSIS — M47812 Spondylosis without myelopathy or radiculopathy, cervical region: Secondary | ICD-10-CM | POA: Diagnosis not present

## 2016-07-27 ENCOUNTER — Ambulatory Visit: Payer: Commercial Managed Care - HMO | Admitting: Internal Medicine

## 2016-08-04 ENCOUNTER — Ambulatory Visit: Payer: Commercial Managed Care - HMO | Admitting: Internal Medicine

## 2017-04-25 ENCOUNTER — Telehealth: Payer: Self-pay | Admitting: Internal Medicine

## 2017-04-25 NOTE — Telephone Encounter (Signed)
Patient's wife is calling to make an appointment for him. She states he has had left sided pain for 2 months. He thinks that the pain is caused by his diabetic medication. He thinks that it is the medication because he missed a dose last night and he felt better. Patient will not be in town until Friday. Appointment made for Monday per request.

## 2017-04-30 ENCOUNTER — Encounter: Payer: Self-pay | Admitting: Internal Medicine

## 2017-04-30 ENCOUNTER — Other Ambulatory Visit (INDEPENDENT_AMBULATORY_CARE_PROVIDER_SITE_OTHER): Payer: Medicare HMO

## 2017-04-30 ENCOUNTER — Ambulatory Visit: Payer: Medicare HMO | Admitting: Internal Medicine

## 2017-04-30 VITALS — BP 118/72 | HR 82 | Temp 98.0°F | Ht 65.0 in | Wt 157.0 lb

## 2017-04-30 DIAGNOSIS — R52 Pain, unspecified: Secondary | ICD-10-CM | POA: Diagnosis not present

## 2017-04-30 DIAGNOSIS — E119 Type 2 diabetes mellitus without complications: Secondary | ICD-10-CM | POA: Diagnosis not present

## 2017-04-30 DIAGNOSIS — M5441 Lumbago with sciatica, right side: Secondary | ICD-10-CM

## 2017-04-30 DIAGNOSIS — R1031 Right lower quadrant pain: Secondary | ICD-10-CM | POA: Insufficient documentation

## 2017-04-30 DIAGNOSIS — M545 Low back pain, unspecified: Secondary | ICD-10-CM | POA: Insufficient documentation

## 2017-04-30 DIAGNOSIS — Z0001 Encounter for general adult medical examination with abnormal findings: Secondary | ICD-10-CM | POA: Diagnosis not present

## 2017-04-30 DIAGNOSIS — R1084 Generalized abdominal pain: Secondary | ICD-10-CM

## 2017-04-30 DIAGNOSIS — G8929 Other chronic pain: Secondary | ICD-10-CM | POA: Diagnosis not present

## 2017-04-30 LAB — BASIC METABOLIC PANEL
BUN: 20 mg/dL (ref 6–23)
CO2: 25 mEq/L (ref 19–32)
Calcium: 8.9 mg/dL (ref 8.4–10.5)
Chloride: 109 mEq/L (ref 96–112)
Creatinine, Ser: 0.96 mg/dL (ref 0.40–1.50)
GFR: 100.73 mL/min (ref >60.00)
Glucose, Bld: 129 mg/dL — ABNORMAL HIGH (ref 70–99)
POTASSIUM: 4.5 meq/L (ref 3.5–5.1)
SODIUM: 140 meq/L (ref 135–145)

## 2017-04-30 LAB — HEPATIC FUNCTION PANEL
ALBUMIN: 4.1 g/dL (ref 3.5–5.2)
ALT: 14 U/L (ref 0–53)
AST: 13 U/L (ref 0–37)
Alkaline Phosphatase: 67 U/L (ref 39–117)
BILIRUBIN DIRECT: 0.1 mg/dL (ref 0.0–0.3)
TOTAL PROTEIN: 6.6 g/dL (ref 6.0–8.3)
Total Bilirubin: 0.3 mg/dL (ref 0.2–1.2)

## 2017-04-30 LAB — CBC WITH DIFFERENTIAL/PLATELET
BASOS PCT: 1.6 % (ref 0.0–3.0)
Basophils Absolute: 0.1 10*3/uL (ref 0.0–0.1)
EOS PCT: 5.9 % — AB (ref 0.0–5.0)
Eosinophils Absolute: 0.4 10*3/uL (ref 0.0–0.7)
HEMATOCRIT: 42.4 % (ref 39.0–52.0)
HEMOGLOBIN: 14.2 g/dL (ref 13.0–17.0)
LYMPHS PCT: 28.7 % (ref 12.0–46.0)
Lymphs Abs: 1.9 10*3/uL (ref 0.7–4.0)
MCHC: 33.4 g/dL (ref 30.0–36.0)
MCV: 93.3 fl (ref 78.0–100.0)
MONOS PCT: 8.8 % (ref 3.0–12.0)
Monocytes Absolute: 0.6 10*3/uL (ref 0.1–1.0)
Neutro Abs: 3.6 10*3/uL (ref 1.4–7.7)
Neutrophils Relative %: 55 % (ref 43.0–77.0)
Platelets: 203 10*3/uL (ref 150.0–400.0)
RBC: 4.55 Mil/uL (ref 4.22–5.81)
RDW: 14.5 % (ref 11.5–15.5)
WBC: 6.6 10*3/uL (ref 4.0–10.5)

## 2017-04-30 LAB — MICROALBUMIN / CREATININE URINE RATIO
Creatinine,U: 93.3 mg/dL
MICROALB/CREAT RATIO: 0.7 mg/g (ref 0.0–30.0)

## 2017-04-30 LAB — LIPID PANEL
CHOLESTEROL: 134 mg/dL (ref 0–200)
HDL: 57.4 mg/dL (ref >39.00)
LDL Cholesterol: 70 mg/dL (ref 0–99)
NonHDL: 76.24
Total CHOL/HDL Ratio: 2
Triglycerides: 32 mg/dL (ref 0.0–149.0)
VLDL: 6.4 mg/dL (ref 0.0–40.0)

## 2017-04-30 LAB — URINALYSIS, ROUTINE W REFLEX MICROSCOPIC
Bilirubin Urine: NEGATIVE
Hgb urine dipstick: NEGATIVE
Ketones, ur: NEGATIVE
LEUKOCYTES UA: NEGATIVE
NITRITE: NEGATIVE
PH: 5.5 (ref 5.0–8.0)
SPECIFIC GRAVITY, URINE: 1.02 (ref 1.000–1.030)
TOTAL PROTEIN, URINE-UPE24: NEGATIVE
Urine Glucose: NEGATIVE
Urobilinogen, UA: 0.2 (ref 0.0–1.0)

## 2017-04-30 LAB — HEMOGLOBIN A1C: HEMOGLOBIN A1C: 7.7 % — AB (ref 4.6–6.5)

## 2017-04-30 LAB — TSH: TSH: 0.54 u[IU]/mL (ref 0.35–4.50)

## 2017-04-30 LAB — PSA: PSA: 3.07 ng/mL (ref 0.10–4.00)

## 2017-04-30 MED ORDER — CELECOXIB 200 MG PO CAPS: 200.0000 mg | ORAL_CAPSULE | Freq: Two times a day (BID) | ORAL | 3 refills | Status: DC | PRN

## 2017-04-30 MED ORDER — GABAPENTIN 100 MG PO CAPS: 100.0000 mg | ORAL_CAPSULE | Freq: Three times a day (TID) | ORAL | 3 refills | Status: DC

## 2017-04-30 MED ORDER — CELECOXIB 200 MG PO CAPS
200.0000 mg | ORAL_CAPSULE | Freq: Two times a day (BID) | ORAL | 3 refills | Status: DC | PRN
Start: 2017-04-30 — End: 2017-04-30

## 2017-04-30 MED ORDER — GABAPENTIN 100 MG PO CAPS: 100.0000 mg | ORAL_CAPSULE | Freq: Three times a day (TID) | ORAL | 1 refills | Status: DC

## 2017-04-30 NOTE — Assessment & Plan Note (Signed)
?   Control but seems o/w stable overall by history and exam, and pt to continue medical treatment as before,  to f/u any worsening symptoms or concerns

## 2017-04-30 NOTE — Assessment & Plan Note (Addendum)
Etiology unclear, for labs as well as CT abd/pelvis r/o underlying pathology  Note:  Total time for pt hx, exam, review of record with pt in the room, determination of diagnoses and plan for further eval and tx is > 40 min, with over 50% spent in coordination and counseling of patient including the differential dx, tx, further evaluation and other management of RLQ pain, right lower back pain, and DM

## 2017-04-30 NOTE — Progress Notes (Signed)
Subjective:    Patient ID: Michael Mcmillan, male    DOB: [birth year], 66 y.o.   MRN: [id number]  HPI  Here for wellness and f/u;  Overall doing ok;  Pt denies Chest pain, worsening SOB, DOE, wheezing, orthopnea, PND, worsening LE edema, palpitations, dizziness or syncope.  Pt denies neurological change such as new headache, facial or extremity weakness.. Pt states overall good compliance with treatment and medications, good tolerability, and has been trying to follow appropriate diet.  Pt denies worsening depressive symptoms, suicidal ideation or panic. No fever, night sweats, wt loss, loss of appetite, or other constitutional symptoms.  Pt states good ability with ADL's, has low fall risk, home safety reviewed and adequate, no other significant changes in hearing or vision, and only occasionally active with exercise.  Has optho appt for jan 2019 at [facility], in fact gets most care there but admits he does not trust them.    Pt denies polydipsia, polyuria, or low sugar symptoms such as weakness or confusion improved with po intake.  Pt states overall good compliance with meds, trying to follow lower cholesterol, diabetic diet, wt overall stable but little exercise however.   Does c/o ongoing fatigue, but denies signficant daytime hypersomnolence.  Also c/o Pt continues to have recurring left LBP for 2 months, without bowel or bladder change, fever, wt loss,  worsening LE pain/numbness/weakness, gait change or falls, but also at same time has burning tingling and dysesthetic type feelling to right lower to right upper abdomen, without rash or swelling.  Mentioned this to [facility] provider but was not examined, instead MD's dogs were discussed Past Medical History:  Diagnosis Date  . Diabetes mellitus without complication ([place])   . Erectile dysfunction   . GERD (gastroesophageal reflux disease)   . Hypogonadism in male    Past Surgical History:  Procedure Laterality Date  . right arm torn ligaments Right approx  1985  . TONSILLECTOMY      reports that he has quit smoking. He has quit using smokeless tobacco. He reports that he does not drink alcohol or use drugs. family history includes Bone cancer in his mother; Colon cancer in his father. Allergies  Allergen Reactions  . Metformin And Related Diarrhea   Current Outpatient Medications on File Prior to Visit  Medication Sig Dispense Refill  . ACCU-CHEK SOFTCLIX LANCETS lancets Use to help check blood sugars twice a day Dx E11.9 300 each 3  . aspirin EC 81 MG tablet Take 1 tablet (81 mg total) by mouth daily. 90 tablet 11  . Blood Glucose Monitoring Suppl (ACCU-CHEK AVIVA PLUS) w/Device KIT Use to check blood sugars twice a day. Dx E11.9 1 kit 0  . Exenatide ER (BYDUREON BCISE) 2 MG/0.85ML AUIJ Inject 2 mg into the skin once a week. 12 pen 3  . glipiZIDE (GLIPIZIDE XL) 10 MG 24 hr tablet Take 1 tablet (10 mg total) by mouth daily with breakfast. 90 tablet 3  . glucose blood (ACCU-CHEK AVIVA PLUS) test strip 1 each by Other route 2 (two) times daily. Use to check blood sugars twice a day Dx E11.9 300 each 3  . glucose blood test strip 1 each by Other route 2 (two) times daily. Use as instructed 200 each 11  . omeprazole (PRILOSEC) 20 MG capsule Take 1 capsule (20 mg total) by mouth daily. 90 capsule 3  . ONE TOUCH LANCETS MISC Use as directed twice per day 200 each 11  . pioglitazone (ACTOS) 45 MG tablet  Take 1 tablet (45 mg total) by mouth daily. 90 tablet 3  . sertraline (ZOLOFT) 100 MG tablet Take 1 tablet (100 mg total) by mouth daily. 90 tablet 3  . tamsulosin (FLOMAX) 0.4 MG CAPS capsule Take 1 capsule (0.4 mg total) by mouth daily. 90 capsule 3   No current facility-administered medications on file prior to visit.    Review of Systems Constitutional: Negative for other unusual diaphoresis, sweats, appetite or weight changes HENT: Negative for other worsening hearing loss, ear pain, facial swelling, mouth sores or neck stiffness.   Eyes:  Negative for other worsening pain, redness or other visual disturbance.  Respiratory: Negative for other stridor or swelling Cardiovascular: Negative for other palpitations or other chest pain  Gastrointestinal: Negative for worsening diarrhea or loose stools, blood in stool, distention or other pain Genitourinary: Negative for hematuria, flank pain or other change in urine volume.  Musculoskeletal: Negative for myalgias or other joint swelling.  Skin: Negative for other color change, or other wound or worsening drainage.  Neurological: Negative for other syncope or numbness. Hematological: Negative for other adenopathy or swelling Psychiatric/Behavioral: Negative for hallucinations, other worsening agitation, SI, self-injury, or new decreased concentration All other system neg per pt    Objective:   Physical Exam BP 118/72   Pulse 82   Temp 98 F (36.7 C) (Oral)   Ht _0  (1.651 m)   Wt 157 lb (71.2 kg)   SpO2 99%   BMI 26.13 kg/m  VS noted, not ill appearing  Constitutional: Pt is oriented to person, place, and time. Appears well-developed and well-nourished, in no significant distress and comfortable Head: Normocephalic and atraumatic  Eyes: Conjunctivae and EOM are normal. Pupils are equal, round, and reactive to light Right Ear: External ear normal without discharge Left Ear: External ear normal without discharge Nose: Nose without discharge or deformity Mouth/Throat: Oropharynx is without other ulcerations and moist  Neck: Normal range of motion. Neck supple. No JVD present. No tracheal deviation present or significant neck LA or mass Cardiovascular: Normal rate, regular rhythm, normal heart sounds and intact distal pulses.   Pulmonary/Chest: WOB normal and breath sounds without rales or wheezing  Abdominal: Soft. Bowel sounds are normal. No HSM, but remarkably sensitive to LT of the RLQ (and lesser RUQ) and guarding without rebound to deeper palpation of the RLQ Spine mild  tender low lumbar midline without paravertebral tender, swelling or rash  Musculoskeletal: Normal range of motion. Exhibits no edema Lymphadenopathy: Has no other cervical adenopathy.  Neurological: Pt is alert and oriented to person, place, and time. Pt has normal reflexes. No cranial nerve deficit. Motor grossly intact, Gait intact Skin: Skin is warm and dry. No rash noted or new ulcerations Psychiatric:  Has normal mood and affect. Behavior is normal without agitation No other exam findings Lab Results  Component Value Date   WBC 6.6 04/30/2017   HGB 14.2 04/30/2017   HCT 42.4 04/30/2017   PLT 203.0 04/30/2017   GLUCOSE 129 (H) 04/30/2017   CHOL 134 04/30/2017   TRIG 32.0 04/30/2017   HDL 57.40 04/30/2017   LDLCALC 70 04/30/2017   ALT 14 04/30/2017   AST 13 04/30/2017   NA 140 04/30/2017   K 4.5 04/30/2017   CL 109 04/30/2017   CREATININE 0.96 04/30/2017   BUN 20 04/30/2017   CO2 25 04/30/2017   TSH 0.54 04/30/2017   PSA 3.07 04/30/2017   HGBA1C 7.7 (H) 04/30/2017   MICROALBUR <0.7 04/30/2017   \  Assessment & Plan:

## 2017-04-30 NOTE — Assessment & Plan Note (Signed)

## 2017-04-30 NOTE — Assessment & Plan Note (Signed)
Mild, but could be assoc with referred pain to RLQ? For celebrex prn, gabapentin tid prn, MRI LS Spine

## 2017-04-30 NOTE — Patient Instructions (Signed)
Please take all new medication as prescribed - the gabapentin and celebrex (both sent to [facility], and hardcopy also given so you can start sooner if you like)  Please continue all other medications as before, and refills have been done if requested.  Please have the pharmacy call with any other refills you may need.  Please continue your efforts at being more active, low cholesterol diet, and weight control.  You are otherwise up to date with prevention measures today.  Please keep your appointments with your specialists as you may have planned  You will be contacted regarding the referral for: CT abd and pelvis for abd pain, and MRI for the lower back for right low back pain with possible nerve involvement  Please go to the LAB in the Basement (turn left off the elevator) for the tests to be done today  You will be contacted by phone if any changes need to be made immediately.  Otherwise, you will receive a letter about your results with an explanation, but please check with MyChart first.  Please remember to sign up for MyChart if you have not done so, as this will be important to you in the future with finding out test results, communicating by private email, and scheduling acute appointments online when needed.  Please return in 6 months, or sooner if needed, with Lab testing done 3-5 days before

## 2017-05-01 ENCOUNTER — Encounter: Payer: Self-pay | Admitting: Internal Medicine

## 2017-05-01 NOTE — Addendum Note (Signed)
Addended by: Biagio Borg on: 05/01/2017 12:55 PM   Modules accepted: Orders

## 2017-05-11 ENCOUNTER — Encounter: Payer: Commercial Managed Care - HMO | Admitting: Internal Medicine

## 2017-05-19 ENCOUNTER — Inpatient Hospital Stay: Admission: RE | Admit: 2017-05-19 | Payer: Medicare HMO | Source: Ambulatory Visit

## 2017-05-28 ENCOUNTER — Inpatient Hospital Stay: Admission: RE | Admit: 2017-05-28 | Payer: Medicare HMO | Source: Ambulatory Visit

## 2017-06-11 ENCOUNTER — Other Ambulatory Visit: Payer: Self-pay | Admitting: Internal Medicine

## 2017-06-11 DIAGNOSIS — F32A Depression, unspecified: Secondary | ICD-10-CM

## 2017-06-11 DIAGNOSIS — E1110 Type 2 diabetes mellitus with ketoacidosis without coma: Secondary | ICD-10-CM

## 2017-06-11 DIAGNOSIS — F329 Major depressive disorder, single episode, unspecified: Secondary | ICD-10-CM

## 2017-06-11 DIAGNOSIS — N400 Enlarged prostate without lower urinary tract symptoms: Secondary | ICD-10-CM

## 2017-06-11 DIAGNOSIS — K219 Gastro-esophageal reflux disease without esophagitis: Secondary | ICD-10-CM

## 2017-06-11 DIAGNOSIS — E119 Type 2 diabetes mellitus without complications: Secondary | ICD-10-CM

## 2017-10-29 ENCOUNTER — Ambulatory Visit (INDEPENDENT_AMBULATORY_CARE_PROVIDER_SITE_OTHER): Payer: Medicare HMO | Admitting: Internal Medicine

## 2017-10-29 ENCOUNTER — Other Ambulatory Visit (INDEPENDENT_AMBULATORY_CARE_PROVIDER_SITE_OTHER): Payer: Medicare HMO

## 2017-10-29 ENCOUNTER — Other Ambulatory Visit: Payer: Self-pay | Admitting: Internal Medicine

## 2017-10-29 ENCOUNTER — Encounter: Payer: Self-pay | Admitting: Internal Medicine

## 2017-10-29 VITALS — BP 120/76 | HR 64 | Temp 98.1°F | Ht 65.0 in | Wt 159.0 lb

## 2017-10-29 DIAGNOSIS — R350 Frequency of micturition: Secondary | ICD-10-CM | POA: Diagnosis not present

## 2017-10-29 DIAGNOSIS — E119 Type 2 diabetes mellitus without complications: Secondary | ICD-10-CM

## 2017-10-29 DIAGNOSIS — R972 Elevated prostate specific antigen [PSA]: Secondary | ICD-10-CM | POA: Diagnosis not present

## 2017-10-29 LAB — URINALYSIS, ROUTINE W REFLEX MICROSCOPIC
BILIRUBIN URINE: NEGATIVE
HGB URINE DIPSTICK: NEGATIVE
Ketones, ur: NEGATIVE
LEUKOCYTES UA: NEGATIVE
NITRITE: NEGATIVE
RBC / HPF: NONE SEEN (ref 0–?)
Specific Gravity, Urine: 1.02 (ref 1.000–1.030)
Total Protein, Urine: NEGATIVE
URINE GLUCOSE: 500 — AB
Urobilinogen, UA: 0.2 (ref 0.0–1.0)
pH: 5.5 (ref 5.0–8.0)

## 2017-10-29 LAB — BASIC METABOLIC PANEL
BUN: 25 mg/dL — ABNORMAL HIGH (ref 6–23)
CALCIUM: 9.2 mg/dL (ref 8.4–10.5)
CO2: 25 meq/L (ref 19–32)
CREATININE: 1.37 mg/dL (ref 0.40–1.50)
Chloride: 104 mEq/L (ref 96–112)
GFR: 66.72 mL/min (ref >60.00)
GLUCOSE: 296 mg/dL — AB (ref 70–99)
Potassium: 4.2 mEq/L (ref 3.5–5.1)
SODIUM: 137 meq/L (ref 135–145)

## 2017-10-29 LAB — HEPATIC FUNCTION PANEL
ALBUMIN: 4.3 g/dL (ref 3.5–5.2)
ALK PHOS: 76 U/L (ref 39–117)
ALT: 16 U/L (ref 0–53)
AST: 12 U/L (ref 0–37)
BILIRUBIN TOTAL: 0.3 mg/dL (ref 0.2–1.2)
Bilirubin, Direct: 0.1 mg/dL (ref 0.0–0.3)
Total Protein: 6.7 g/dL (ref 6.0–8.3)

## 2017-10-29 LAB — LIPID PANEL
CHOLESTEROL: 162 mg/dL (ref 0–200)
HDL: 63.9 mg/dL (ref >39.00)
LDL CALC: 84 mg/dL (ref 0–99)
NonHDL: 97.67
Total CHOL/HDL Ratio: 3
Triglycerides: 66 mg/dL (ref 0.0–149.0)
VLDL: 13.2 mg/dL (ref 0.0–40.0)

## 2017-10-29 LAB — PSA: PSA: 4.63 ng/mL — ABNORMAL HIGH (ref 0.10–4.00)

## 2017-10-29 LAB — HEMOGLOBIN A1C: Hgb A1c MFr Bld: 8 % — ABNORMAL HIGH (ref 4.6–6.5)

## 2017-10-29 MED ORDER — DOXYCYCLINE HYCLATE 100 MG PO TABS: 100.0000 mg | ORAL_TABLET | Freq: Two times a day (BID) | ORAL | 0 refills | Status: DC

## 2017-10-29 MED ORDER — OXYBUTYNIN CHLORIDE ER 5 MG PO TB24: 5.0000 mg | ORAL_TABLET | Freq: Every day | ORAL | 3 refills | Status: DC

## 2017-10-29 NOTE — Progress Notes (Signed)
Subjective:    Patient ID: Michael Mcmillan, male    DOB: Jun 23, 1950, 67 y.o.   MRN: [id number]  HPI  Here to f/u; overall doing ok,  Pt denies chest pain, increasing sob or doe, wheezing, orthopnea, PND, increased LE swelling, palpitations, dizziness or syncope.  Pt denies new neurological symptoms such as new headache, or facial or extremity weakness or numbness.  Pt denies polydipsia, polyuria, or low sugar episode.  Pt states overall good compliance with meds, mostly trying to follow appropriate diet but admits not so much recently, with wt overall stable,  but little exercise however.  Denies urinary symptoms such as dysuria, urgency, flank pain, hematuria or n/v, fever, chills, but has freqneucy going multiple times at night, at least every 2 hrs at night.  Good compliance with flomax, seemed to be better but more worse recently.  Denies worsening reflux, abd pain, dysphagia, n/v, bowel change or blood.  No wetting accidents.  Hoping to avoid urology for now unless med tx does not referral.   Has hx of elevated PSA over 6 but reduced to 3's last visit  Taking on a new job with driving to [place] soon Past Medical History:  Diagnosis Date  . Diabetes mellitus without complication ([place])   . Erectile dysfunction   . GERD (gastroesophageal reflux disease)   . Hypogonadism in male    Past Surgical History:  Procedure Laterality Date  . right arm torn ligaments Right approx 1985  . TONSILLECTOMY      reports that he has quit smoking. He has quit using smokeless tobacco. He reports that he does not drink alcohol or use drugs. family history includes Bone cancer in his mother; Colon cancer in his father. Allergies  Allergen Reactions  . Metformin And Related Diarrhea   Current Outpatient Medications on File Prior to Visit  Medication Sig Dispense Refill  . ACCU-CHEK SOFTCLIX LANCETS lancets Use to help check blood sugars twice a day Dx E11.9 300 each 3  . aspirin EC 81 MG tablet Take 1  tablet (81 mg total) by mouth daily. 90 tablet 11  . Blood Glucose Monitoring Suppl (ACCU-CHEK AVIVA PLUS) w/Device KIT Use to check blood sugars twice a day. Dx E11.9 1 kit 0  . celecoxib (CELEBREX) 200 MG capsule Take 1 capsule (200 mg total) by mouth 2 (two) times daily as needed. 180 capsule 3  . Exenatide ER (BYDUREON BCISE) 2 MG/0.85ML AUIJ Inject 2 mg into the skin once a week. 12 pen 3  . gabapentin (NEURONTIN) 100 MG capsule Take 1 capsule (100 mg total) by mouth 3 (three) times daily. 270 capsule 1  . glipiZIDE (GLUCOTROL XL) 10 MG 24 hr tablet TAKE 1 TABLET   BY MOUTH DAILY WITH BREAKFAST. 90 tablet 3  . glucose blood (ACCU-CHEK AVIVA PLUS) test strip 1 each by Other route 2 (two) times daily. Use to check blood sugars twice a day Dx E11.9 300 each 3  . glucose blood test strip 1 each by Other route 2 (two) times daily. Use as instructed 200 each 11  . omeprazole (PRILOSEC) 20 MG capsule TAKE 1 CAPSULE EVERY DAY 90 capsule 3  . ONE TOUCH LANCETS MISC Use as directed twice per day 200 each 11  . pioglitazone (ACTOS) 45 MG tablet TAKE 1 TABLET EVERY DAY 90 tablet 3  . sertraline (ZOLOFT) 100 MG tablet TAKE 1 TABLET EVERY DAY 90 tablet 3  . tamsulosin (FLOMAX) 0.4 MG CAPS capsule TAKE 1 CAPSULE EVERY  DAY 90 capsule 3   No current facility-administered medications on file prior to visit.    Review of Systems  Constitutional: Negative for other unusual diaphoresis or sweats HENT: Negative for ear discharge or swelling Eyes: Negative for other worsening visual disturbances Respiratory: Negative for stridor or other swelling  Gastrointestinal: Negative for worsening distension or other blood Genitourinary: Negative for retention or other urinary change Musculoskeletal: Negative for other MSK pain or swelling Skin: Negative for color change or other new lesions Neurological: Negative for worsening tremors and other numbness  Psychiatric/Behavioral: Negative for worsening agitation or  other fatigue All other system neg per pt    Objective:   Physical Exam BP 120/76   Pulse 64   Temp 98.1 F (36.7 C) (Oral)   Ht 5' 5" (1.651 m)   Wt 159 lb (72.1 kg)   SpO2 98%   BMI 26.46 kg/m  VS noted,  Constitutional: Pt appears in NAD HENT: Head: NCAT.  Right Ear: External ear normal.  Left Ear: External ear normal.  Eyes: . Pupils are equal, round, and reactive to light. Conjunctivae and EOM are normal Nose: without d/c or deformity Neck: Neck supple. Gross normal ROM Cardiovascular: Normal rate and regular rhythm.   Pulmonary/Chest: Effort normal and breath sounds without rales or wheezing.  Abd:  Soft, NT, ND, + BS, no organomegaly Neurological: Pt is alert. At baseline orientation, motor grossly intact Skin: Skin is warm. No rashes, other new lesions, no LE edema Psychiatric: Pt behavior is normal without agitation  No other exam findings Lab Results  Component Value Date   WBC 6.6 04/30/2017   HGB 14.2 04/30/2017   HCT 42.4 04/30/2017   PLT 203.0 04/30/2017   GLUCOSE 129 (H) 04/30/2017   CHOL 134 04/30/2017   TRIG 32.0 04/30/2017   HDL 57.40 04/30/2017   LDLCALC 70 04/30/2017   ALT 14 04/30/2017   AST 13 04/30/2017   NA 140 04/30/2017   K 4.5 04/30/2017   CL 109 04/30/2017   CREATININE 0.96 04/30/2017   BUN 20 04/30/2017   CO2 25 04/30/2017   TSH 0.54 04/30/2017   PSA 3.07 04/30/2017   HGBA1C 7.7 (H) 04/30/2017   MICROALBUR <0.7 04/30/2017       Assessment & Plan:

## 2017-10-29 NOTE — Assessment & Plan Note (Signed)
Etiology unclear, possible uncontrolled DM again or OAB for UA and labs as ordered, also trial oxybutinin 5 qd

## 2017-10-29 NOTE — Patient Instructions (Addendum)
Please take all new medication as prescribed - the medication for the bladder  Please continue all other medications as before, and refills have been done if requested.  Please have the pharmacy call with any other refills you may need.  Please continue your efforts at being more active, low cholesterol diet, and weight control.  You are otherwise up to date with prevention measures today.  Please keep your appointments with your specialists as you may have planned  Please go to the LAB in the Basement (turn left off the elevator) for the tests to be done today  You will be contacted by phone if any changes need to be made immediately.  Otherwise, you will receive a letter about your results with an explanation, but please check with MyChart first.  Please remember to sign up for MyChart if you have not done so, as this will be important to you in the future with finding out test results, communicating by private email, and scheduling acute appointments online when needed.  Please return in 6 months, or sooner if needed, with Lab testing done 3-5 days before

## 2017-10-29 NOTE — Assessment & Plan Note (Signed)
stable overall by history and exam, recent data reviewed with pt, and pt to continue medical treatment as before,  to f/u any worsening symptoms or concerns, for f/u lab today 

## 2017-10-30 ENCOUNTER — Telehealth: Payer: Self-pay

## 2017-10-30 NOTE — Telephone Encounter (Signed)
Called pt, pt was not available at the moment to discuss but will call back for lab results.

## 2017-10-30 NOTE — Telephone Encounter (Signed)
-----   Message from Biagio Borg, MD sent at 10/29/2017  6:48 PM EDT ----- Left message on MyChart, pt to cont same tx except  The test results show that your current treatment is OK, except the PSA is elevated again, and the A1c is too high.  We need add an antibiotic for the prostate given your symptoms today, ok to hold off on taking the oxybutinin for bladder, and please make a return office visit to consider Insulin, unless you are ok with just starting Lantus 10 units per day.  I will ask the staff to contact you, and I will send the prescription for the antibiotic to the pharmacy  [place] to please inform pt, I will do rx for antibiotic

## 2017-11-02 ENCOUNTER — Telehealth: Payer: Self-pay | Admitting: Internal Medicine

## 2017-11-02 ENCOUNTER — Telehealth: Payer: Self-pay

## 2017-11-02 MED ORDER — DOXYCYCLINE HYCLATE 100 MG PO TABS: 100.0000 mg | ORAL_TABLET | Freq: Two times a day (BID) | ORAL | 0 refills | Status: DC

## 2017-11-02 NOTE — Telephone Encounter (Signed)
Resent rx to correct pharmacy.Marland KitchenJohny Mcmillan

## 2017-11-02 NOTE — Telephone Encounter (Signed)
Copied from [place] [id number]. Topic: Quick Communication - Rx Refill/Question >> Nov 02, 2017  9:48 AM Carolyn Stare wrote: Medication doxycycline (VIBRA-TABS) 100 MG tablet     med was sent to the wrong pharmacy and ask that the med be sent to [facility]   Has the patient contacted their pharmacy yes   Preferred Pharmacy  [facility]: Please be advised that RX refills may take up to 3 business days. We ask that you follow-up with your pharmacy. >> Nov 02, 2017  1:56 PM Boyd Kerbs wrote: [facility]estic to pick up prescription and was expensive  Asking for a different prescription to be called in.

## 2017-11-02 NOTE — Telephone Encounter (Signed)
Copied from [place] [id number]. Topic: Quick Communication - Rx Refill/Question >> Nov 02, 2017  9:48 AM Carolyn Stare wrote: Medication doxycycline (VIBRA-TABS) 100 MG tablet     med was sent to the wrong pharmacy and ask that the med be sent to [facility]   Has the patient contacted their pharmacy yes   Preferred Pharmacy  [facility]: Please be advised that RX refills may take up to 3 business days. We ask that you follow-up with your pharmacy.

## 2017-12-05 ENCOUNTER — Telehealth: Payer: Self-pay | Admitting: Internal Medicine

## 2017-12-05 NOTE — Telephone Encounter (Signed)
Copied from [place] [id number]. Topic: Quick Communication - See Telephone Encounter >> Dec 05, 2017  9:11 AM Ahmed Prima L wrote: CRM for notification. See Telephone encounter for: 12/05/17.  Patient's wife called and wanted to see if Dr Jenny Reichmann would put him on "ozempic" for his diabetes. She said that if he needs to come in that he can come in on any Monday that is available. Call back [phone]

## 2017-12-05 NOTE — Telephone Encounter (Signed)
Dr. Jenny Reichmann is an OV needed?

## 2017-12-05 NOTE — Telephone Encounter (Signed)
Left msg for wife to call back to discuss.

## 2017-12-05 NOTE — Telephone Encounter (Signed)
This would have to replace the bydureon but I suppose could be done  I would encourage wife to check with pharmacy on cost however as this is new and not likely covered by insurance, and can be hundreds of dollars each month

## 2017-12-17 ENCOUNTER — Encounter: Payer: Self-pay | Admitting: Internal Medicine

## 2017-12-17 ENCOUNTER — Ambulatory Visit (INDEPENDENT_AMBULATORY_CARE_PROVIDER_SITE_OTHER): Payer: Medicare HMO | Admitting: Internal Medicine

## 2017-12-17 VITALS — BP 126/82 | HR 82 | Temp 98.1°F | Ht 65.0 in | Wt 158.0 lb

## 2017-12-17 DIAGNOSIS — R972 Elevated prostate specific antigen [PSA]: Secondary | ICD-10-CM | POA: Diagnosis not present

## 2017-12-17 DIAGNOSIS — F329 Major depressive disorder, single episode, unspecified: Secondary | ICD-10-CM

## 2017-12-17 DIAGNOSIS — F32A Depression, unspecified: Secondary | ICD-10-CM

## 2017-12-17 DIAGNOSIS — E119 Type 2 diabetes mellitus without complications: Secondary | ICD-10-CM

## 2017-12-17 MED ORDER — SEMAGLUTIDE(0.25 OR 0.5MG/DOS) 2 MG/1.5ML ~~LOC~~ SOPN: PEN_INJECTOR | SUBCUTANEOUS | 3 refills | Status: DC

## 2017-12-17 NOTE — Assessment & Plan Note (Signed)
Mild uncontrolled, o/w stable overall by history and exam, recent data reviewed with pt, and pt to continue medical treatment as before except ok for ozempic instead of byduron if covered by insurance,  to f/u any worsening symptoms or concerns,

## 2017-12-17 NOTE — Assessment & Plan Note (Signed)
Improved lab, asympt, for f/u with next labs

## 2017-12-17 NOTE — Patient Instructions (Addendum)
OK for  Ozempic, which is given at 0.25 mg weekly for 4 weeks, then 0.5 mg weekly after that (and later the maximum dose is 1 mg if needed)  Please continue all other medications as before, and refills have been done if requested.  Please have the pharmacy call with any other refills you may need.  Please continue your efforts at being more active, low cholesterol diabetic diet, and weight control.  Please keep your appointments with your specialists as you may have planned  Please return as planned with :Labs done 3-5 days ahead

## 2017-12-17 NOTE — Assessment & Plan Note (Signed)
stable overall by history and exam, and pt to continue medical treatment as before,  to f/u any worsening symptoms or concerns 

## 2017-12-17 NOTE — Progress Notes (Signed)
Subjective:    Patient ID: Michael Mcmillan, male    DOB: 09-14-1950, 67 y.o.   MRN: [id number]  HPI  Here to f/u; overall doing ok,  Pt denies chest pain, increasing sob or doe, wheezing, orthopnea, PND, increased LE swelling, palpitations, dizziness or syncope.  Pt denies new neurological symptoms such as new headache, or facial or extremity weakness or numbness.  Pt denies polydipsia, polyuria, or low sugar episode.  Pt states overall good compliance with meds, mostly trying to follow appropriate diet, with wt overall stable,  but little exercise however.  Admits to lack of dietary compliance prior to last a1c, now back to former better DM diet, asking for ozempic instead of bydureon.  Denies urinary symptoms such as dysuria, frequency, urgency, flank pain, hematuria or n/v, fever, chills.  No new complaints  Denies worsening depressive symptoms, suicidal ideation, or panic Past Medical History:  Diagnosis Date  . Diabetes mellitus without complication ([place])   . Erectile dysfunction   . GERD (gastroesophageal reflux disease)   . Hypogonadism in male    Past Surgical History:  Procedure Laterality Date  . right arm torn ligaments Right approx 1985  . TONSILLECTOMY      reports that he has quit smoking. He has quit using smokeless tobacco. He reports that he does not drink alcohol or use drugs. family history includes Bone cancer in his mother; Colon cancer in his father. Allergies  Allergen Reactions  . Metformin And Related Diarrhea   Current Outpatient Medications on File Prior to Visit  Medication Sig Dispense Refill  . ACCU-CHEK SOFTCLIX LANCETS lancets Use to help check blood sugars twice a day Dx E11.9 300 each 3  . aspirin EC 81 MG tablet Take 1 tablet (81 mg total) by mouth daily. 90 tablet 11  . Blood Glucose Monitoring Suppl (ACCU-CHEK AVIVA PLUS) w/Device KIT Use to check blood sugars twice a day. Dx E11.9 1 kit 0  . celecoxib (CELEBREX) 200 MG capsule Take 1 capsule (200  mg total) by mouth 2 (two) times daily as needed. 180 capsule 3  . gabapentin (NEURONTIN) 100 MG capsule Take 1 capsule (100 mg total) by mouth 3 (three) times daily. 270 capsule 1  . glipiZIDE (GLUCOTROL XL) 10 MG 24 hr tablet TAKE 1 TABLET   BY MOUTH DAILY WITH BREAKFAST. 90 tablet 3  . glucose blood (ACCU-CHEK AVIVA PLUS) test strip 1 each by Other route 2 (two) times daily. Use to check blood sugars twice a day Dx E11.9 300 each 3  . glucose blood test strip 1 each by Other route 2 (two) times daily. Use as instructed 200 each 11  . omeprazole (PRILOSEC) 20 MG capsule TAKE 1 CAPSULE EVERY DAY 90 capsule 3  . ONE TOUCH LANCETS MISC Use as directed twice per day 200 each 11  . pioglitazone (ACTOS) 45 MG tablet TAKE 1 TABLET EVERY DAY 90 tablet 3  . sertraline (ZOLOFT) 100 MG tablet TAKE 1 TABLET EVERY DAY 90 tablet 3  . tamsulosin (FLOMAX) 0.4 MG CAPS capsule TAKE 1 CAPSULE EVERY DAY 90 capsule 3   No current facility-administered medications on file prior to visit.    Review of Systems  Constitutional: Negative for other unusual diaphoresis or sweats HENT: Negative for ear discharge or swelling Eyes: Negative for other worsening visual disturbances Respiratory: Negative for stridor or other swelling  Gastrointestinal: Negative for worsening distension or other blood Genitourinary: Negative for retention or other urinary change Musculoskeletal: Negative for other  MSK pain or swelling Skin: Negative for color change or other new lesions Neurological: Negative for worsening tremors and other numbness  Psychiatric/Behavioral: Negative for worsening agitation or other fatigue All other system neg per pt    Objective:   Physical Exam BP 126/82   Pulse 82   Temp 98.1 F (36.7 C) (Oral)   Ht _0  (1.651 m)   Wt 158 lb (71.7 kg)   SpO2 96%   BMI 26.29 kg/m  VS noted,  Constitutional: Pt appears in NAD HENT: Head: NCAT.  Right Ear: External ear normal.  Left Ear: External ear  normal.  Eyes: . Pupils are equal, round, and reactive to light. Conjunctivae and EOM are normal Nose: without d/c or deformity Neck: Neck supple. Gross normal ROM Cardiovascular: Normal rate and regular rhythm.   Pulmonary/Chest: Effort normal and breath sounds without rales or wheezing.  Abd:  Soft, NT, ND, + BS, no organomegaly Neurological: Pt is alert. At baseline orientation, motor grossly intact Skin: Skin is warm. No rashes, other new lesions, no LE edema Psychiatric: Pt behavior is normal without agitation , not depressed affect  No other exam findings Lab Results  Component Value Date   WBC 6.6 04/30/2017   HGB 14.2 04/30/2017   HCT 42.4 04/30/2017   PLT 203.0 04/30/2017   GLUCOSE 296 (H) 10/29/2017   CHOL 162 10/29/2017   TRIG 66.0 10/29/2017   HDL 63.90 10/29/2017   LDLCALC 84 10/29/2017   ALT 16 10/29/2017   AST 12 10/29/2017   NA 137 10/29/2017   K 4.2 10/29/2017   CL 104 10/29/2017   CREATININE 1.37 10/29/2017   BUN 25 (H) 10/29/2017   CO2 25 10/29/2017   TSH 0.54 04/30/2017   PSA 4.63 (H) 10/29/2017   HGBA1C 8.0 (H) 10/29/2017   MICROALBUR <0.7 04/30/2017       Assessment & Plan:

## 2017-12-18 ENCOUNTER — Telehealth: Payer: Self-pay

## 2017-12-18 MED ORDER — SEMAGLUTIDE(0.25 OR 0.5MG/DOS) 2 MG/1.5ML ~~LOC~~ SOPN: PEN_INJECTOR | SUBCUTANEOUS | 3 refills | Status: DC

## 2017-12-18 NOTE — Telephone Encounter (Signed)
Copied from [place]. Topic: General - Other >> Dec 18, 2017  3:40 PM Yvette Rack wrote: Reason for CRM: patient would like for the Semaglutide [facility]) 0.25 or 0.5 MG/DOSE SOPN to go to [employer] stating that it cost to much

## 2017-12-26 NOTE — Telephone Encounter (Signed)
Pt wife called and stated that Mcarthur Rossetti is requiring an authorization code for RX. [employer] Cb#[phone]. Pt wife not on DPR

## 2017-12-26 NOTE — Telephone Encounter (Signed)
PA was initiated earlier today on Cover My Meds.

## 2017-12-29 ENCOUNTER — Ambulatory Visit (HOSPITAL_COMMUNITY)
Admission: EM | Admit: 2017-12-29 | Discharge: 2017-12-29 | Disposition: A | Discharge status: Home / Self Care | Payer: Medicare HMO | Attending: Family Medicine | Admitting: Family Medicine

## 2017-12-29 ENCOUNTER — Other Ambulatory Visit: Payer: Self-pay

## 2017-12-29 ENCOUNTER — Encounter (HOSPITAL_COMMUNITY): Payer: Self-pay

## 2017-12-29 DIAGNOSIS — R1011 Right upper quadrant pain: Secondary | ICD-10-CM | POA: Diagnosis not present

## 2017-12-29 NOTE — ED Provider Notes (Signed)
[facility]   [id number] 12/29/17 Arrival Time: 0737  ASSESSMENT & PLAN:  1. Right upper quadrant abdominal pain    Possible cholelithiasis. He is tolerating discomfort well at this time.   Discharge Instructions     You have been seen today for abdominal pain. Your evaluation was not suggestive of any emergent condition requiring medical intervention at this time. However, some abdominal problems make take more time to appear. Therefore, it is important for you to watch for any new symptoms or worsening of your current condition. Please proceed to the Emergency Department immediately should you feel worse in any way or have any of the following symptoms: increasing or different abdominal pain, persistent vomiting, fevers, or shaking chills.      Follow-up Information    Biagio Borg, MD.   Specialties:  Internal Medicine, Radiology Why:  Call to discuss getting an ultrasound to look at your gallbladder. Contact information: [place] [address] [phone]        [place].   Specialty:  Emergency Medicine Why:  If symptoms worsen. Contact information: [address] [id number] [place] [phone]          Reviewed expectations re: course of current medical issues. Questions answered. Outlined signs and symptoms indicating need for more acute intervention. Patient verbalized understanding. After Visit Summary given.   SUBJECTIVE:  Michael Mcmillan is a 67 y.o. male who presents with complaint of intermittent abdominal discomfort. Onset gradual, over the past few weeks. Location: RUQ without radiation. Described as aching and sharp at times. Seems to come on quickly and last for several minutes, sometimes up to one hour. Symptoms are on/off and stable since beginning. Aggravating factors: sometimes eating. Alleviating factors: none. Associated symptoms:  very mild nausea. He denies fever and myalgias. Appetite: normal. PO intake: normal. Ambulatory without assistance. Urinary symptoms: none. Last bowel movement today without blood. OTC treatment: none.   Past Surgical History:  Procedure Laterality Date  . right arm torn ligaments Right approx 1985  . TONSILLECTOMY      ROS: As per HPI. All other systems negative.  OBJECTIVE:  Vitals:   12/29/17 1527  BP: 137/84  Pulse: 63  Resp: 16  Temp: 98 F (36.7 C)  TempSrc: Oral  SpO2: 99%    General appearance: alert; no distress Lungs: clear to auscultation bilaterally Heart: regular rate and rhythm Abdomen: soft; non-distended; mild tenderness over RUQ; bowel sounds present; no masses or organomegaly; no guarding or rebound tenderness Back: no CVA tenderness; FROM at hips Extremities: no edema; symmetrical with no gross deformities Skin: warm and dry Neurologic: normal gait Psychological: alert and cooperative; normal mood and affect   Allergies  Allergen Reactions  . Metformin And Related Diarrhea                                               Past Medical History:  Diagnosis Date  . Diabetes mellitus without complication ([place])   . Erectile dysfunction   . GERD (gastroesophageal reflux disease)   . Hypogonadism in male    Social History   Socioeconomic History  . Marital status: Married    Spouse name: Not on file  . Number of children: Not on file  . Years of education: Not on file  .  Highest education level: Not on file  Occupational History  . Not on file  Social Needs  . Financial resource strain: Not on file  . Food insecurity:    Worry: Not on file    Inability: Not on file  . Transportation needs:    Medical: Not on file    Non-medical: Not on file  Tobacco Use  . Smoking status: Former Research scientist (life sciences)  . Smokeless tobacco: Former Network engineer and Sexual Activity  . Alcohol use: No    Alcohol/week: 0.0 standard drinks  . Drug use: No  . Sexual  activity: Not on file  Lifestyle  . Physical activity:    Days per week: Not on file    Minutes per session: Not on file  . Stress: Not on file  Relationships  . Social connections:    Talks on phone: Not on file    Gets together: Not on file    Attends religious service: Not on file    Active member of club or organization: Not on file    Attends meetings of clubs or organizations: Not on file    Relationship status: Not on file  . Intimate partner violence:    Fear of current or ex partner: Not on file    Emotionally abused: Not on file    Physically abused: Not on file    Forced sexual activity: Not on file  Other Topics Concern  . Not on file  Social History Narrative  . Not on file   Family History  Problem Relation Age of Onset  . Colon cancer Father   . Bone cancer Mother     Vanessa Kick, MD 01/02/18 [phone]

## 2017-12-29 NOTE — ED Triage Notes (Signed)
Pt presents today with RUQ pain that has been going on for 3 weeks. Described as a sharp pain. Hurts to the touch. States he has had some constipation but had a BM this morning. No NVD.

## 2017-12-29 NOTE — Discharge Instructions (Signed)
You have been seen today for abdominal pain. Your evaluation was not suggestive of any emergent condition requiring medical intervention at this time. However, some abdominal problems make take more time to appear. Therefore, it is important for you to watch for any new symptoms or worsening of your current condition. Please proceed to the Emergency Department immediately should you feel worse in any way or have any of the following symptoms: increasing or different abdominal pain, persistent vomiting, fevers, or shaking chills.

## 2017-12-31 ENCOUNTER — Ambulatory Visit (INDEPENDENT_AMBULATORY_CARE_PROVIDER_SITE_OTHER): Payer: Medicare HMO | Admitting: Internal Medicine

## 2017-12-31 ENCOUNTER — Encounter: Payer: Self-pay | Admitting: Internal Medicine

## 2017-12-31 ENCOUNTER — Other Ambulatory Visit (INDEPENDENT_AMBULATORY_CARE_PROVIDER_SITE_OTHER): Payer: Medicare HMO

## 2017-12-31 VITALS — BP 124/72 | HR 79 | Temp 98.0°F | Ht 65.0 in | Wt 152.0 lb

## 2017-12-31 DIAGNOSIS — F32A Depression, unspecified: Secondary | ICD-10-CM

## 2017-12-31 DIAGNOSIS — E119 Type 2 diabetes mellitus without complications: Secondary | ICD-10-CM

## 2017-12-31 DIAGNOSIS — F329 Major depressive disorder, single episode, unspecified: Secondary | ICD-10-CM

## 2017-12-31 DIAGNOSIS — R1011 Right upper quadrant pain: Secondary | ICD-10-CM

## 2017-12-31 LAB — CBC WITH DIFFERENTIAL/PLATELET
Basophils Absolute: 0.1 10*3/uL (ref 0.0–0.1)
Basophils Relative: 0.6 % (ref 0.0–3.0)
Eosinophils Absolute: 0.4 10*3/uL (ref 0.0–0.7)
Eosinophils Relative: 4.7 % (ref 0.0–5.0)
HCT: 45.2 % (ref 39.0–52.0)
Hemoglobin: 15.3 g/dL (ref 13.0–17.0)
LYMPHS ABS: 1.8 10*3/uL (ref 0.7–4.0)
Lymphocytes Relative: 22.3 % (ref 12.0–46.0)
MCHC: 33.7 g/dL (ref 30.0–36.0)
MCV: 91.4 fl (ref 78.0–100.0)
MONO ABS: 0.7 10*3/uL (ref 0.1–1.0)
Monocytes Relative: 8.2 % (ref 3.0–12.0)
NEUTROS ABS: 5.3 10*3/uL (ref 1.4–7.7)
NEUTROS PCT: 64.2 % (ref 43.0–77.0)
PLATELETS: 210 10*3/uL (ref 150.0–400.0)
RBC: 4.95 Mil/uL (ref 4.22–5.81)
RDW: 14.3 % (ref 11.5–15.5)
WBC: 8.3 10*3/uL (ref 4.0–10.5)

## 2017-12-31 LAB — BASIC METABOLIC PANEL
BUN: 16 mg/dL (ref 6–23)
CALCIUM: 9.4 mg/dL (ref 8.4–10.5)
CHLORIDE: 107 meq/L (ref 96–112)
CO2: 24 meq/L (ref 19–32)
Creatinine, Ser: 1.01 mg/dL (ref 0.40–1.50)
GFR: 94.8 mL/min (ref >60.00)
Glucose, Bld: 316 mg/dL — ABNORMAL HIGH (ref 70–99)
POTASSIUM: 4.3 meq/L (ref 3.5–5.1)
SODIUM: 138 meq/L (ref 135–145)

## 2017-12-31 LAB — URINALYSIS, ROUTINE W REFLEX MICROSCOPIC
Bilirubin Urine: NEGATIVE
Hgb urine dipstick: NEGATIVE
Ketones, ur: NEGATIVE
Leukocytes, UA: NEGATIVE
NITRITE: NEGATIVE
PH: 5.5 (ref 5.0–8.0)
Specific Gravity, Urine: 1.02 (ref 1.000–1.030)
TOTAL PROTEIN, URINE-UPE24: NEGATIVE
Urobilinogen, UA: 0.2 (ref 0.0–1.0)

## 2017-12-31 LAB — LIPASE: Lipase: 12 U/L (ref 11.0–59.0)

## 2017-12-31 LAB — HEPATIC FUNCTION PANEL
ALT: 14 U/L (ref 0–53)
AST: 11 U/L (ref 0–37)
Albumin: 4.1 g/dL (ref 3.5–5.2)
Alkaline Phosphatase: 75 U/L (ref 39–117)
BILIRUBIN TOTAL: 0.3 mg/dL (ref 0.2–1.2)
Bilirubin, Direct: 0.1 mg/dL (ref 0.0–0.3)
Total Protein: 6.5 g/dL (ref 6.0–8.3)

## 2017-12-31 LAB — PSA: PSA: 4.43 ng/mL — AB (ref 0.10–4.00)

## 2017-12-31 MED ORDER — GABAPENTIN 100 MG PO CAPS: 100.0000 mg | ORAL_CAPSULE | Freq: Three times a day (TID) | ORAL | 1 refills | Status: DC

## 2017-12-31 NOTE — Assessment & Plan Note (Signed)
Ok to start ozempic when able, cont all other tx

## 2017-12-31 NOTE — Addendum Note (Signed)
Addended by: Biagio Borg on: 12/31/2017 04:56 PM   Modules accepted: Orders

## 2017-12-31 NOTE — Assessment & Plan Note (Signed)
stable overall by history and exam, and pt to continue medical treatment as before,  to f/u any worsening symptoms or concerns 

## 2017-12-31 NOTE — Progress Notes (Signed)
Subjective:    Patient ID: Michael Mcmillan, male    DOB: 1950-07-21, 67 y.o.   MRN: [id number]  HPI  Here to f/u with c/o 3 wks ruq pain, dull and sharp, radiates to the side but not really the back, overall mild but occas severe, intermittent, no n/v, fever, or rash.  Denies worsening reflux, abd pain, dysphagia, n/v, bowel change or blood., though has Mild constipation occasionally.   Denies urinary symptoms such as dysuria, frequency, urgency, flank pain, hematuria or n/v, fever, chills except some frequency lately.  Was seen at [facility] 8/17 but pt deferred eval to today.  Pt denies chest pain, increased sob or doe, wheezing, orthopnea, PND, increased LE swelling, palpitations, dizziness or syncope.  Pt denies new neurological symptoms such as new headache, or facial or extremity weakness or numbness, though he described with pain as "sensitive" to touch lightly.   Pt denies polydipsia, polyuria, or low sugar episode.  Pt states overall good compliance with meds, mostly trying to follow appropriate diet, with wt overall stable.  Has not yet started the ozempic  Still has GB intact. Vicente Males worsening depressive symptoms, suicidal ideation, or panic Past Medical History:  Diagnosis Date  . Diabetes mellitus without complication ([place])   . Erectile dysfunction   . GERD (gastroesophageal reflux disease)   . Hypogonadism in male    Past Surgical History:  Procedure Laterality Date  . right arm torn ligaments Right approx 1985  . TONSILLECTOMY      reports that he has quit smoking. He has quit using smokeless tobacco. He reports that he does not drink alcohol or use drugs. family history includes Bone cancer in his mother; Colon cancer in his father. Allergies  Allergen Reactions  . Metformin And Related Diarrhea   Current Outpatient Medications on File Prior to Visit  Medication Sig Dispense Refill  . ACCU-CHEK SOFTCLIX LANCETS lancets Use to help check blood sugars twice a day Dx E11.9 300  each 3  . aspirin EC 81 MG tablet Take 1 tablet (81 mg total) by mouth daily. 90 tablet 11  . Blood Glucose Monitoring Suppl (ACCU-CHEK AVIVA PLUS) w/Device KIT Use to check blood sugars twice a day. Dx E11.9 1 kit 0  . celecoxib (CELEBREX) 200 MG capsule Take 1 capsule (200 mg total) by mouth 2 (two) times daily as needed. 180 capsule 3  . cholecalciferol (VITAMIN D) 1000 units tablet Take 1,000 Units by mouth daily.    Marland Kitchen glipiZIDE (GLUCOTROL XL) 10 MG 24 hr tablet TAKE 1 TABLET   BY MOUTH DAILY WITH BREAKFAST. 90 tablet 3  . glucose blood (ACCU-CHEK AVIVA PLUS) test strip 1 each by Other route 2 (two) times daily. Use to check blood sugars twice a day Dx E11.9 300 each 3  . glucose blood test strip 1 each by Other route 2 (two) times daily. Use as instructed 200 each 11  . omeprazole (PRILOSEC) 20 MG capsule TAKE 1 CAPSULE EVERY DAY 90 capsule 3  . ONE TOUCH LANCETS MISC Use as directed twice per day 200 each 11  . pioglitazone (ACTOS) 45 MG tablet TAKE 1 TABLET EVERY DAY 90 tablet 3  . Semaglutide (OZEMPIC) 0.25 or 0.5 MG/DOSE SOPN Inject 0.25 mg into the skin once a week for 30 days, THEN 0.5 mg once a week. 6 mL 3  . sertraline (ZOLOFT) 100 MG tablet TAKE 1 TABLET EVERY DAY 90 tablet 3  . tamsulosin (FLOMAX) 0.4 MG CAPS capsule TAKE 1 CAPSULE  EVERY DAY 90 capsule 3   No current facility-administered medications on file prior to visit.    Review of Systems  Constitutional: Negative for other unusual diaphoresis or sweats HENT: Negative for ear discharge or swelling Eyes: Negative for other worsening visual disturbances Respiratory: Negative for stridor or other swelling  Gastrointestinal: Negative for worsening distension or other blood Genitourinary: Negative for retention or other urinary change Musculoskeletal: Negative for other MSK pain or swelling Skin: Negative for color change or other new lesions Neurological: Negative for worsening tremors and other numbness    Psychiatric/Behavioral: Negative for worsening agitation or other fatigue All other system neg per pt    Objective:   Physical Exam BP 124/72   Pulse 79   Temp 98 F (36.7 C) (Oral)   Ht _0  (1.651 m)   Wt 152 lb (68.9 kg)   SpO2 97%   BMI 25.29 kg/m  VS noted, appears fatigued, ? Mild ill Constitutional: Pt appears in NAD HENT: Head: NCAT.  Right Ear: External ear normal.  Left Ear: External ear normal.  Eyes: . Pupils are equal, round, and reactive to light. Conjunctivae and EOM are normal Nose: without d/c or deformity Neck: Neck supple. Gross normal ROM Cardiovascular: Normal rate and regular rhythm.   Pulmonary/Chest: Effort normal and breath sounds without rales or wheezing.  Abd:  Soft, NT, ND, + BS, no organomegaly except for mild to mod RUQ tender, no rash Neurological: Pt is alert. At baseline orientation, motor grossly intact Skin: Skin is warm. No rashes, other new lesions, no LE edema Psychiatric: Pt behavior is normal without agitation , not depressed affect No other exam findings Lab Results  Component Value Date   WBC 6.6 04/30/2017   HGB 14.2 04/30/2017   HCT 42.4 04/30/2017   PLT 203.0 04/30/2017   GLUCOSE 296 (H) 10/29/2017   CHOL 162 10/29/2017   TRIG 66.0 10/29/2017   HDL 63.90 10/29/2017   LDLCALC 84 10/29/2017   ALT 16 10/29/2017   AST 12 10/29/2017   NA 137 10/29/2017   K 4.2 10/29/2017   CL 104 10/29/2017   CREATININE 1.37 10/29/2017   BUN 25 (H) 10/29/2017   CO2 25 10/29/2017   TSH 0.54 04/30/2017   PSA 4.63 (H) 10/29/2017   HGBA1C 8.0 (H) 10/29/2017   MICROALBUR <0.7 04/30/2017       Assessment & Plan:

## 2017-12-31 NOTE — Addendum Note (Signed)
Addended by: Biagio Borg on: 12/31/2017 04:40 PM   Modules accepted: Orders

## 2017-12-31 NOTE — Assessment & Plan Note (Addendum)
Etiology unclear, cant r/o GB or other, but sounds dysesthetic in nature and may be a superficial neuritis; ok for trial gabapentin, but also for labs as ordered and Ct abd/pelvix w/ CM, stat  Note:  Total time for pt hx, exam, review of record with pt in the room, determination of diagnoses and plan for further eval and tx is > 40 min, with over 50% spent in coordination and counseling of patient including the differential dx, tx, further evaluation and other management of ruq pain, DM and depression

## 2017-12-31 NOTE — Patient Instructions (Addendum)
Please take all new medication as prescribed - the gabapentin for nerve pain  Please continue all other medications as before, and refills have been done if requested.  Please have the pharmacy call with any other refills you may need.  Please continue your efforts at being more active, low cholesterol diet, and weight control.  You are otherwise up to date with prevention measures today.  Please keep your appointments with your specialists as you may have planned  You will be contacted regarding the referral for: Ct scan - to see PCCs now  Please go to the LAB in the Basement (turn left off the elevator) for the tests to be done today  You will be contacted by phone if any changes need to be made immediately.  Otherwise, you will receive a letter about your results with an explanation, but please check with MyChart first.  Please remember to sign up for MyChart if you have not done so, as this will be important to you in the future with finding out test results, communicating by private email, and scheduling acute appointments online when needed.

## 2018-01-07 ENCOUNTER — Encounter: Payer: Self-pay | Admitting: Internal Medicine

## 2018-01-07 ENCOUNTER — Ambulatory Visit (INDEPENDENT_AMBULATORY_CARE_PROVIDER_SITE_OTHER): Payer: Medicare HMO | Admitting: Internal Medicine

## 2018-01-07 VITALS — BP 118/82 | HR 69 | Temp 98.2°F | Ht 65.0 in | Wt 166.0 lb

## 2018-01-07 DIAGNOSIS — R52 Pain, unspecified: Secondary | ICD-10-CM

## 2018-01-07 DIAGNOSIS — E119 Type 2 diabetes mellitus without complications: Secondary | ICD-10-CM | POA: Diagnosis not present

## 2018-01-07 DIAGNOSIS — R972 Elevated prostate specific antigen [PSA]: Secondary | ICD-10-CM

## 2018-01-07 DIAGNOSIS — R1011 Right upper quadrant pain: Secondary | ICD-10-CM | POA: Diagnosis not present

## 2018-01-07 MED ORDER — CELECOXIB 200 MG PO CAPS: 200.0000 mg | ORAL_CAPSULE | Freq: Two times a day (BID) | ORAL | 3 refills | Status: AC | PRN

## 2018-01-07 NOTE — Assessment & Plan Note (Signed)
Ok for ozempic start today, check cbgs and call for > 200

## 2018-01-07 NOTE — Progress Notes (Signed)
Subjective:    Patient ID: Michael Mcmillan, male    DOB: Jan 26, 1951, 67 y.o.   MRN: [id number]  HPI  Here to f/u RUQ pain, did not follow through on recommended CT last wk;  Gets sleepy with gabapenytin 100 tid so not really taking and not sure if helping;  Has not tried qhs dosing.  Overall pain maybe slightly improved but essentially the same in location, quality, severity and constant. Recent labs negative including LFTs.  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  Pt denies chest pain, increased sob or doe, wheezing, orthopnea, PND, increased LE swelling, palpitations, dizziness or syncope.  Pt denies new neurological symptoms such as new headache, or facial or extremity weakness or numbness   Pt denies polydipsia, polyuria,  Has his new ozempic with him today, hoping for shot demonstration and start today since he is off work today.  Denies urinary symptoms such as dysuria, frequency, urgency, flank pain, hematuria or n/v, fever, chills. Past Medical History:  Diagnosis Date  . Diabetes mellitus without complication ([place])   . Erectile dysfunction   . GERD (gastroesophageal reflux disease)   . Hypogonadism in male    Past Surgical History:  Procedure Laterality Date  . right arm torn ligaments Right approx 1985  . TONSILLECTOMY      reports that he has quit smoking. He has quit using smokeless tobacco. He reports that he does not drink alcohol or use drugs. family history includes Bone cancer in his mother; Colon cancer in his father. Allergies  Allergen Reactions  . Metformin And Related Diarrhea   Current Outpatient Medications on File Prior to Visit  Medication Sig Dispense Refill  . ACCU-CHEK SOFTCLIX LANCETS lancets Use to help check blood sugars twice a day Dx E11.9 300 each 3  . aspirin EC 81 MG tablet Take 1 tablet (81 mg total) by mouth daily. 90 tablet 11  . Blood Glucose Monitoring Suppl (ACCU-CHEK  AVIVA PLUS) w/Device KIT Use to check blood sugars twice a day. Dx E11.9 1 kit 0  . cholecalciferol (VITAMIN D) 1000 units tablet Take 1,000 Units by mouth daily.    Marland Kitchen gabapentin (NEURONTIN) 100 MG capsule Take 1 capsule (100 mg total) by mouth 3 (three) times daily. 270 capsule 1  . glipiZIDE (GLUCOTROL XL) 10 MG 24 hr tablet TAKE 1 TABLET   BY MOUTH DAILY WITH BREAKFAST. 90 tablet 3  . glucose blood (ACCU-CHEK AVIVA PLUS) test strip 1 each by Other route 2 (two) times daily. Use to check blood sugars twice a day Dx E11.9 300 each 3  . glucose blood test strip 1 each by Other route 2 (two) times daily. Use as instructed 200 each 11  . omeprazole (PRILOSEC) 20 MG capsule TAKE 1 CAPSULE EVERY DAY 90 capsule 3  . ONE TOUCH LANCETS MISC Use as directed twice per day 200 each 11  . pioglitazone (ACTOS) 45 MG tablet TAKE 1 TABLET EVERY DAY 90 tablet 3  . Semaglutide (OZEMPIC) 0.25 or 0.5 MG/DOSE SOPN Inject 0.25 mg into the skin once a week for 30 days, THEN 0.5 mg once a week. 6 mL 3  . sertraline (ZOLOFT) 100 MG tablet TAKE 1 TABLET EVERY DAY 90 tablet 3  . tamsulosin (FLOMAX) 0.4 MG CAPS capsule TAKE 1 CAPSULE EVERY DAY 90 capsule 3   No current facility-administered medications on file prior to visit.    Review of Systems  Constitutional: Negative for other unusual diaphoresis or sweats HENT: Negative for ear discharge or swelling Eyes: Negative for other worsening visual disturbances Respiratory: Negative for stridor or other swelling  Gastrointestinal: Negative for worsening distension or other blood Genitourinary: Negative for retention or other urinary change Musculoskeletal: Negative for other MSK pain or swelling Skin: Negative for color change or other new lesions Neurological: Negative for worsening tremors and other numbness  Psychiatric/Behavioral: Negative for worsening agitation or other fatigue All other system neg per pt    Objective:   Physical Exam BP 118/82   Pulse 69    Temp 98.2 F (36.8 C) (Oral)   Ht _0  (1.651 m)   Wt 166 lb (75.3 kg)   SpO2 99%   BMI 27.62 kg/m  VS noted,  Constitutional: Pt appears in NAD HENT: Head: NCAT.  Right Ear: External ear normal.  Left Ear: External ear normal.  Eyes: . Pupils are equal, round, and reactive to light. Conjunctivae and EOM are normal Nose: without d/c or deformity Neck: Neck supple. Gross normal ROM Cardiovascular: Normal rate and regular rhythm.   Pulmonary/Chest: Effort normal and breath sounds without rales or wheezing.  Abd:  Soft,, ND, + BS, no organomegaly, mild tender RUQ Neurological: Pt is alert. At baseline orientation, motor grossly intact Skin: Skin is warm. No rashes, other new lesions, no LE edema Psychiatric: Pt behavior is normal without agitation  No other exam findings Lab Results  Component Value Date   WBC 8.3 12/31/2017   HGB 15.3 12/31/2017   HCT 45.2 12/31/2017   PLT 210.0 12/31/2017   GLUCOSE 316 (H) 12/31/2017   CHOL 162 10/29/2017   TRIG 66.0 10/29/2017   HDL 63.90 10/29/2017   LDLCALC 84 10/29/2017   ALT 14 12/31/2017   AST 11 12/31/2017   NA 138 12/31/2017   K 4.3 12/31/2017   CL 107 12/31/2017   CREATININE 1.01 12/31/2017   BUN 16 12/31/2017   CO2 24 12/31/2017   TSH 0.54 04/30/2017   PSA 4.43 (H) 12/31/2017   HGBA1C 8.0 (H) 10/29/2017   MICROALBUR <0.7 04/30/2017       Assessment & Plan:

## 2018-01-07 NOTE — Patient Instructions (Signed)
Please continue all other medications as before, and refills have been done if requested - the celebrex  Ok to start the ozempic as you are doing today  Please have the pharmacy call with any other refills you may need.  Please continue your efforts at being more active, low cholesterol diet, and weight control.  Please keep your appointments with your specialists as you may have planned

## 2018-01-07 NOTE — Assessment & Plan Note (Signed)
With recent normal labs except for DM, I suspect MSK etiology; pt declines CT, will restart the celebrex bid prn,  to f/u any worsening symptoms or concerns

## 2018-01-07 NOTE — Assessment & Plan Note (Signed)
stable overall by history and exam, recent data reviewed with pt, and pt to continue medical treatment as before,  to f/u any worsening symptoms or concerns  

## 2018-04-29 ENCOUNTER — Other Ambulatory Visit (INDEPENDENT_AMBULATORY_CARE_PROVIDER_SITE_OTHER): Payer: Medicare HMO

## 2018-04-29 ENCOUNTER — Ambulatory Visit (INDEPENDENT_AMBULATORY_CARE_PROVIDER_SITE_OTHER): Payer: Medicare HMO | Admitting: Internal Medicine

## 2018-04-29 ENCOUNTER — Encounter: Payer: Self-pay | Admitting: Internal Medicine

## 2018-04-29 ENCOUNTER — Ambulatory Visit (INDEPENDENT_AMBULATORY_CARE_PROVIDER_SITE_OTHER)
Admission: RE | Admit: 2018-04-29 | Discharge: 2018-04-29 | Disposition: A | Discharge status: Home / Self Care | Payer: Medicare HMO | Source: Ambulatory Visit | Attending: Internal Medicine | Admitting: Internal Medicine

## 2018-04-29 VITALS — BP 128/86 | HR 92 | Temp 98.0°F | Ht 65.0 in | Wt 156.0 lb

## 2018-04-29 DIAGNOSIS — R079 Chest pain, unspecified: Secondary | ICD-10-CM | POA: Diagnosis not present

## 2018-04-29 DIAGNOSIS — Z0001 Encounter for general adult medical examination with abnormal findings: Secondary | ICD-10-CM | POA: Diagnosis not present

## 2018-04-29 DIAGNOSIS — Z23 Encounter for immunization: Secondary | ICD-10-CM

## 2018-04-29 DIAGNOSIS — R972 Elevated prostate specific antigen [PSA]: Secondary | ICD-10-CM

## 2018-04-29 DIAGNOSIS — K219 Gastro-esophageal reflux disease without esophagitis: Secondary | ICD-10-CM

## 2018-04-29 DIAGNOSIS — E119 Type 2 diabetes mellitus without complications: Secondary | ICD-10-CM

## 2018-04-29 LAB — BASIC METABOLIC PANEL
BUN: 16 mg/dL (ref 6–23)
CO2: 26 mEq/L (ref 19–32)
Calcium: 9.6 mg/dL (ref 8.4–10.5)
Chloride: 106 mEq/L (ref 96–112)
Creatinine, Ser: 1.03 mg/dL (ref 0.40–1.50)
GFR: 92.59 mL/min (ref >60.00)
Glucose, Bld: 62 mg/dL — ABNORMAL LOW (ref 70–99)
Potassium: 4.3 mEq/L (ref 3.5–5.1)
Sodium: 139 mEq/L (ref 135–145)

## 2018-04-29 LAB — CBC WITH DIFFERENTIAL/PLATELET
Basophils Absolute: 0.1 10*3/uL (ref 0.0–0.1)
Basophils Relative: 0.8 % (ref 0.0–3.0)
Eosinophils Absolute: 0.4 10*3/uL (ref 0.0–0.7)
Eosinophils Relative: 4.2 % (ref 0.0–5.0)
HEMATOCRIT: 46.9 % (ref 39.0–52.0)
Hemoglobin: 15.8 g/dL (ref 13.0–17.0)
LYMPHS PCT: 24.2 % (ref 12.0–46.0)
Lymphs Abs: 2.4 10*3/uL (ref 0.7–4.0)
MCHC: 33.8 g/dL (ref 30.0–36.0)
MCV: 92.1 fl (ref 78.0–100.0)
Monocytes Absolute: 0.9 10*3/uL (ref 0.1–1.0)
Monocytes Relative: 9.2 % (ref 3.0–12.0)
Neutro Abs: 6.2 10*3/uL (ref 1.4–7.7)
Neutrophils Relative %: 61.6 % (ref 43.0–77.0)
Platelets: 235 10*3/uL (ref 150.0–400.0)
RBC: 5.09 Mil/uL (ref 4.22–5.81)
RDW: 14.7 % (ref 11.5–15.5)
WBC: 10 10*3/uL (ref 4.0–10.5)

## 2018-04-29 LAB — HEPATIC FUNCTION PANEL
ALT: 15 U/L (ref 0–53)
AST: 14 U/L (ref 0–37)
Albumin: 4.7 g/dL (ref 3.5–5.2)
Alkaline Phosphatase: 72 U/L (ref 39–117)
Bilirubin, Direct: 0.1 mg/dL (ref 0.0–0.3)
TOTAL PROTEIN: 7.2 g/dL (ref 6.0–8.3)
Total Bilirubin: 0.4 mg/dL (ref 0.2–1.2)

## 2018-04-29 LAB — URINALYSIS, ROUTINE W REFLEX MICROSCOPIC
Bilirubin Urine: NEGATIVE
Hgb urine dipstick: NEGATIVE
Ketones, ur: NEGATIVE
Leukocytes, UA: NEGATIVE
Nitrite: NEGATIVE
RBC / HPF: NONE SEEN (ref 0–?)
Specific Gravity, Urine: 1.025 (ref 1.000–1.030)
Total Protein, Urine: NEGATIVE
Urine Glucose: NEGATIVE
Urobilinogen, UA: 0.2 (ref 0.0–1.0)
WBC, UA: NONE SEEN (ref 0–?)
pH: 5.5 (ref 5.0–8.0)

## 2018-04-29 LAB — MICROALBUMIN / CREATININE URINE RATIO
Creatinine,U: 169.5 mg/dL
Microalb Creat Ratio: 0.6 mg/g (ref 0.0–30.0)
Microalb, Ur: 0.9 mg/dL (ref 0.0–1.9)

## 2018-04-29 LAB — HEMOGLOBIN A1C: Hgb A1c MFr Bld: 6.1 % (ref 4.6–6.5)

## 2018-04-29 LAB — LIPID PANEL
CHOLESTEROL: 141 mg/dL (ref 0–200)
HDL: 52.2 mg/dL (ref >39.00)
LDL Cholesterol: 77 mg/dL (ref 0–99)
NonHDL: 89.22
Total CHOL/HDL Ratio: 3
Triglycerides: 60 mg/dL (ref 0.0–149.0)
VLDL: 12 mg/dL (ref 0.0–40.0)

## 2018-04-29 LAB — TSH: TSH: 0.95 u[IU]/mL (ref 0.35–4.50)

## 2018-04-29 LAB — PSA: PSA: 3.94 ng/mL (ref 0.10–4.00)

## 2018-04-29 MED ORDER — PANTOPRAZOLE SODIUM 40 MG PO TBEC: 40.0000 mg | DELAYED_RELEASE_TABLET | Freq: Every day | ORAL | 3 refills | Status: DC

## 2018-04-29 NOTE — Assessment & Plan Note (Signed)
stable overall by history and exam, recent data reviewed with pt, and pt to continue medical treatment as before,  to f/u any worsening symptoms or concerns  

## 2018-04-29 NOTE — Assessment & Plan Note (Signed)
Asympt, for f/u psa 

## 2018-04-29 NOTE — Progress Notes (Signed)
Subjective:    Patient ID: Michael Mcmillan, male    DOB: [birth year], 67 y.o.   MRN: [id number]  HPI  Here for wellness and f/u;  Overall doing ok;  Pt denies worsening SOB, DOE, wheezing, orthopnea, PND, worsening LE edema, palpitations, dizziness or syncope, diaphoresis, n/v.   Pt denies neurological change such as new headache, facial or extremity weakness.  Pt denies polydipsia, polyuria, or low sugar symptoms. Pt states overall good compliance with treatment and medications, good tolerability, and has been trying to follow appropriate diet.  Pt denies worsening depressive symptoms, suicidal ideation or panic. No fever, night sweats, wt loss, loss of appetite, or other constitutional symptoms.  Pt states good ability with ADL's, has low fall risk, home safety reviewed and adequate, no other significant changes in hearing or vision, and only occasionally active with exercise. Has had 1 wk recurring mild dull left chest pain without assoc symptoms above, and no fever, cough, wheezing.  Is nonpleuritic, nonexertional, and nonpositional.  Also, Has had mild to mod worsening reflux, without abd pain, dysphagia, n/v, bowel change or blood, but has had intermittent belching, flatus.  Not controlled anymore by omeprazole 20 qd. Past Medical History:  Diagnosis Date  . Diabetes mellitus without complication ([place])   . Erectile dysfunction   . GERD (gastroesophageal reflux disease)   . Hypogonadism in male    Past Surgical History:  Procedure Laterality Date  . right arm torn ligaments Right approx 1985  . TONSILLECTOMY      reports that he has quit smoking. He has quit using smokeless tobacco. He reports that he does not drink alcohol or use drugs. family history includes Bone cancer in his mother; Colon cancer in his father. Allergies  Allergen Reactions  . Metformin And Related Diarrhea   Current Outpatient Medications on File Prior to Visit  Medication Sig Dispense Refill  . ACCU-CHEK  SOFTCLIX LANCETS lancets Use to help check blood sugars twice a day Dx E11.9 300 each 3  . aspirin EC 81 MG tablet Take 1 tablet (81 mg total) by mouth daily. 90 tablet 11  . Blood Glucose Monitoring Suppl (ACCU-CHEK AVIVA PLUS) w/Device KIT Use to check blood sugars twice a day. Dx E11.9 1 kit 0  . celecoxib (CELEBREX) 200 MG capsule Take 1 capsule (200 mg total) by mouth 2 (two) times daily as needed. 180 capsule 3  . cholecalciferol (VITAMIN D) 1000 units tablet Take 1,000 Units by mouth daily.    Marland Kitchen gabapentin (NEURONTIN) 100 MG capsule Take 1 capsule (100 mg total) by mouth 3 (three) times daily. 270 capsule 1  . glipiZIDE (GLUCOTROL XL) 10 MG 24 hr tablet TAKE 1 TABLET   BY MOUTH DAILY WITH BREAKFAST. 90 tablet 3  . glucose blood (ACCU-CHEK AVIVA PLUS) test strip 1 each by Other route 2 (two) times daily. Use to check blood sugars twice a day Dx E11.9 300 each 3  . glucose blood test strip 1 each by Other route 2 (two) times daily. Use as instructed 200 each 11  . ONE TOUCH LANCETS MISC Use as directed twice per day 200 each 11  . pioglitazone (ACTOS) 45 MG tablet TAKE 1 TABLET EVERY DAY 90 tablet 3  . Semaglutide (OZEMPIC) 0.25 or 0.5 MG/DOSE SOPN Inject 0.25 mg into the skin once a week for 30 days, THEN 0.5 mg once a week. 6 mL 3  . sertraline (ZOLOFT) 100 MG tablet TAKE 1 TABLET EVERY DAY 90 tablet 3  .  tamsulosin (FLOMAX) 0.4 MG CAPS capsule TAKE 1 CAPSULE EVERY DAY 90 capsule 3   No current facility-administered medications on file prior to visit.    Review of Systems Constitutional: Negative for other unusual diaphoresis, sweats, appetite or weight changes HENT: Negative for other worsening hearing loss, ear pain, facial swelling, mouth sores or neck stiffness.   Eyes: Negative for other worsening pain, redness or other visual disturbance.  Respiratory: Negative for other stridor or swelling Cardiovascular: Negative for other palpitations or other chest pain  Gastrointestinal:  Negative for worsening diarrhea or loose stools, blood in stool, distention or other pain Genitourinary: Negative for hematuria, flank pain or other change in urine volume.  Musculoskeletal: Negative for myalgias or other joint swelling.  Skin: Negative for other color change, or other wound or worsening drainage.  Neurological: Negative for other syncope or numbness. Hematological: Negative for other adenopathy or swelling Psychiatric/Behavioral: Negative for hallucinations, other worsening agitation, SI, self-injury, or new decreased concentration All other system neg per pt    Objective:   Physical Exam BP 128/86   Pulse 92   Temp 98 F (36.7 C) (Oral)   Ht _0  (1.651 m)   Wt 156 lb (70.8 kg)   SpO2 98%   BMI 25.96 kg/m  VS noted,  Constitutional: Pt is oriented to person, place, and time. Appears well-developed and well-nourished, in no significant distress and comfortable Head: Normocephalic and atraumatic  Eyes: Conjunctivae and EOM are normal. Pupils are equal, round, and reactive to light Right Ear: External ear normal without discharge Left Ear: External ear normal without discharge Nose: Nose without discharge or deformity Mouth/Throat: Oropharynx is without other ulcerations and moist  Neck: Normal range of motion. Neck supple. No JVD present. No tracheal deviation present or significant neck LA or mass Cardiovascular: Normal rate, regular rhythm, normal heart sounds and intact distal pulses.   Pulmonary/Chest: WOB normal and breath sounds without rales or wheezing  Abdominal: Soft. Bowel sounds are normal. NT. No HSM  Musculoskeletal: Normal range of motion. Exhibits no edema Lymphadenopathy: Has no other cervical adenopathy.  Neurological: Pt is alert and oriented to person, place, and time. Pt has normal reflexes. No cranial nerve deficit. Motor grossly intact, Gait intact Skin: Skin is warm and dry. No rash noted or new ulcerations Psychiatric:  Has normal mood  and affect. Behavior is normal without agitation No other exam findings  ECG today I have personally interpreted:   NSR without T or ST changes  Lab Results  Component Value Date   WBC 10.0 04/29/2018   HGB 15.8 04/29/2018   HCT 46.9 04/29/2018   PLT 235.0 04/29/2018   GLUCOSE 62 (L) 04/29/2018   CHOL 141 04/29/2018   TRIG 60.0 04/29/2018   HDL 52.20 04/29/2018   LDLCALC 77 04/29/2018   ALT 15 04/29/2018   AST 14 04/29/2018   NA 139 04/29/2018   K 4.3 04/29/2018   CL 106 04/29/2018   CREATININE 1.03 04/29/2018   BUN 16 04/29/2018   CO2 26 04/29/2018   TSH 0.95 04/29/2018   PSA 3.94 04/29/2018   HGBA1C 6.1 04/29/2018   MICROALBUR 0.9 04/29/2018       Assessment & Plan:

## 2018-04-29 NOTE — Assessment & Plan Note (Addendum)
Atypical, etiology unclear, ? MSK, ecg reviewed, for cxr, labs as ordered, and stress testing, consider cardiology  In addition to the time spent performing CPE, I spent an additional 25 minutes face to face,in which greater than 50% of this time was spent in counseling and coordination of care for patient's acute illness as documented, including the differential dx, treatment, further evaluation and other management of chest pain, GERD, DM, PSA elevation

## 2018-04-29 NOTE — Patient Instructions (Addendum)
Your EKG was OK today  OK to stop the omeprazole 20 mg per day  Please take all new medication as prescribed - the generic for protonix 40 mg per day  Please continue all other medications as before, and refills have been done if requested.  Please have the pharmacy call with any other refills you may need.  Please continue your efforts at being more active, low cholesterol diet, and weight control.  You are otherwise up to date with prevention measures today.  Please keep your appointments with your specialists as you may have planned  You will be contacted regarding the referral for: Stress testing  Please go to the XRAY Department in the Basement (go straight as you get off the elevator) for the x-ray testing  Please go to the LAB in the Basement (turn left off the elevator) for the tests to be done today  You will be contacted by phone if any changes need to be made immediately.  Otherwise, you will receive a letter about your results with an explanation, but please check with MyChart first.  Please remember to sign up for MyChart if you have not done so, as this will be important to you in the future with finding out test results, communicating by private email, and scheduling acute appointments online when needed.  Please return in 6 months, or sooner if needed, with Lab testing done 3-5 days before

## 2018-04-29 NOTE — Assessment & Plan Note (Signed)

## 2018-04-29 NOTE — Assessment & Plan Note (Signed)
Uncontrolled, for change prilosec 20 to protonix 40 qd, consider GI if not improved

## 2018-04-30 ENCOUNTER — Encounter: Payer: Self-pay | Admitting: Internal Medicine

## 2018-05-21 ENCOUNTER — Telehealth (HOSPITAL_COMMUNITY): Payer: Self-pay | Admitting: *Deleted

## 2018-05-21 NOTE — Telephone Encounter (Signed)
Left message on voicemail in reference to upcoming appointment scheduled for 05/27/17. Phone number given for a call back so details instructions can be given. Blannie Shedlock, Ranae [place]

## 2018-05-22 ENCOUNTER — Other Ambulatory Visit: Payer: Self-pay | Admitting: Internal Medicine

## 2018-05-22 DIAGNOSIS — R079 Chest pain, unspecified: Secondary | ICD-10-CM

## 2018-05-27 ENCOUNTER — Encounter (HOSPITAL_COMMUNITY): Payer: Medicare HMO

## 2018-08-01 ENCOUNTER — Encounter: Payer: Self-pay | Admitting: Cardiovascular Disease

## 2018-08-07 ENCOUNTER — Telehealth: Payer: Self-pay

## 2018-08-07 NOTE — Telephone Encounter (Signed)
Left message for pt to call back  °

## 2018-08-09 NOTE — Telephone Encounter (Signed)
Attempted to call patient again regarding office visit. I called home number x 2 and each time it sounded like someone picked up then hung up. I attempted to call cell number and it rang but has no voice mail set up. Patient's appointment is cancelled until we can reach him to discuss.  I am sending to Covid 19 call pool as level 1 due to note that patient is having chest pain. Have been unable to discuss symptoms with patient.

## 2018-08-12 ENCOUNTER — Ambulatory Visit: Payer: Medicare HMO | Admitting: Cardiovascular Disease

## 2018-09-02 ENCOUNTER — Other Ambulatory Visit: Payer: Self-pay | Admitting: Internal Medicine

## 2018-09-02 DIAGNOSIS — E1110 Type 2 diabetes mellitus with ketoacidosis without coma: Secondary | ICD-10-CM

## 2018-09-02 DIAGNOSIS — E119 Type 2 diabetes mellitus without complications: Secondary | ICD-10-CM

## 2018-09-02 NOTE — Telephone Encounter (Signed)
Done to [employer]

## 2018-09-03 NOTE — Telephone Encounter (Signed)
Call placed to pt to try to reschedule his new pt appt that was cancelled 08/12/2018 due to Covid 19. Answering machine was full. Called pt's cell # and voicemail has not been set up.

## 2018-09-24 ENCOUNTER — Other Ambulatory Visit: Payer: Self-pay | Admitting: Internal Medicine

## 2018-09-24 DIAGNOSIS — N400 Enlarged prostate without lower urinary tract symptoms: Secondary | ICD-10-CM

## 2018-10-03 ENCOUNTER — Telehealth: Payer: Self-pay | Admitting: Internal Medicine

## 2018-10-03 MED ORDER — PANTOPRAZOLE SODIUM 40 MG PO TBEC: 40.0000 mg | DELAYED_RELEASE_TABLET | Freq: Every day | ORAL | 3 refills | Status: DC

## 2018-10-03 NOTE — Telephone Encounter (Signed)
Copied from [place] [phone]. Topic: Quick Communication - Rx Refill/Question >> Oct 03, 2018  1:25 PM Wynetta Emery, [place] C wrote: Medication: Omeprazole -- Per Mcarthur Rossetti, pt is requesting this new medication   Has the patient contacted their pharmacy? Yes   (Agent: If no, request that the patient contact the pharmacy for the refill.) (Agent: If yes, when and what did the pharmacy advise?)  Preferred Pharmacy (with phone number or street name): [facility], [place] [phone] (Phone) [phone] (Fax)    Agent: Please be advised that RX refills may take up to 3 business days. We ask that you follow-up with your pharmacy.

## 2018-10-23 ENCOUNTER — Telehealth: Payer: Self-pay | Admitting: Internal Medicine

## 2018-10-23 MED ORDER — DOCUSATE SODIUM 100 MG PO CAPS: 100.0000 mg | ORAL_CAPSULE | Freq: Two times a day (BID) | ORAL | 5 refills | Status: AC

## 2018-10-23 NOTE — Addendum Note (Signed)
Addended by: Biagio Borg on: 10/23/2018 06:27 PM   Modules accepted: Orders

## 2018-10-23 NOTE — Telephone Encounter (Signed)
Copied from [place] [phone]. Topic: Quick Communication - Rx Refill/Question >> Oct 23, 2018  1:36 PM Gustavus Messing wrote: Medication: A prescription for a stool softener   Has the patient contacted their pharmacy? No. (Agent: If no, request that the patient contact the pharmacy for the refill.) The patient would like a prescription for a stool softener because hs is constipated.   Preferred Pharmacy (with phone number or street name): [facility] [place], [place] [phone] (Phone) [phone] (Fax)    Agent: Please be advised that RX refills may take up to 3 business days. We ask that you follow-up with your pharmacy.

## 2018-10-23 NOTE — Telephone Encounter (Signed)
Done erx 

## 2018-10-28 ENCOUNTER — Ambulatory Visit: Payer: Medicare HMO | Admitting: Internal Medicine

## 2018-12-02 ENCOUNTER — Other Ambulatory Visit: Payer: Self-pay | Admitting: Internal Medicine

## 2018-12-02 DIAGNOSIS — N400 Enlarged prostate without lower urinary tract symptoms: Secondary | ICD-10-CM

## 2018-12-23 ENCOUNTER — Other Ambulatory Visit: Payer: Self-pay | Admitting: Internal Medicine

## 2018-12-23 NOTE — Telephone Encounter (Signed)
Ok to refill   thanks

## 2018-12-26 ENCOUNTER — Other Ambulatory Visit: Payer: Self-pay

## 2018-12-26 MED ORDER — PANTOPRAZOLE SODIUM 40 MG PO TBEC: 40.0000 mg | DELAYED_RELEASE_TABLET | Freq: Every day | ORAL | 3 refills | Status: DC

## 2019-04-08 ENCOUNTER — Telehealth: Payer: Self-pay | Admitting: Internal Medicine

## 2019-04-08 NOTE — Telephone Encounter (Signed)
Called patient back to schedule AWV, but phone disconnected during call. Patient will need to give the office a call back to schedule AWV. SF

## 2019-04-12 ENCOUNTER — Other Ambulatory Visit: Payer: Self-pay | Admitting: Internal Medicine

## 2019-04-12 DIAGNOSIS — E1110 Type 2 diabetes mellitus with ketoacidosis without coma: Secondary | ICD-10-CM

## 2019-05-26 ENCOUNTER — Telehealth: Payer: Self-pay

## 2019-05-26 NOTE — Telephone Encounter (Signed)
04/29/18 A1c result faxed to number below.  Left detailed message informing pt.

## 2019-05-26 NOTE — Telephone Encounter (Signed)
Copied from [place] [phone]. Topic: General - Inquiry >> May 26, 2019  1:15 PM Alease Frame wrote: Reason for CRM: Patient is needing his current A1c reading to be faxed over to [facility]  Fax number [phone]

## 2019-06-03 ENCOUNTER — Telehealth: Payer: Self-pay | Admitting: Internal Medicine

## 2019-06-03 NOTE — Telephone Encounter (Signed)
Patient's wife informed of below.

## 2019-06-03 NOTE — Telephone Encounter (Signed)
No sorry, not needed as per Jul 14 2014 letter per dr Lorrin Goodell to resend letter if wife wants this  Pt now due for colonoscopy 2026

## 2019-06-03 NOTE — Telephone Encounter (Signed)
Patient's wife calling for a referral for colonoscopy that is due at the end of February.

## 2019-06-06 ENCOUNTER — Other Ambulatory Visit: Payer: Self-pay

## 2019-06-06 MED ORDER — OZEMPIC (1 MG/DOSE) 2 MG/1.5ML ~~LOC~~ SOPN: 1.0000 mg | PEN_INJECTOR | SUBCUTANEOUS | 3 refills | Status: DC

## 2019-06-06 NOTE — Telephone Encounter (Signed)
PHARMACY:  [facility] on [address]. RX: ozempic Per pt request

## 2019-06-18 ENCOUNTER — Other Ambulatory Visit: Payer: Self-pay | Admitting: Internal Medicine

## 2019-06-18 DIAGNOSIS — N400 Enlarged prostate without lower urinary tract symptoms: Secondary | ICD-10-CM

## 2019-06-29 ENCOUNTER — Ambulatory Visit: Payer: Medicare HMO | Attending: Internal Medicine

## 2019-06-29 DIAGNOSIS — Z23 Encounter for immunization: Secondary | ICD-10-CM | POA: Insufficient documentation

## 2019-06-29 NOTE — Progress Notes (Signed)
   Covid-19 Vaccination Clinic  Name:  Michael Mcmillan    MRN: [id number] DOB: October 20, 1950  06/29/2019  Michael Mcmillan was observed post Covid-19 immunization for 15 minutes without incidence. He was provided with Vaccine Information Sheet and instruction to access the V-Safe system.   Michael Mcmillan was instructed to call 911 with any severe reactions post vaccine: Marland Kitchen Difficulty breathing  . Swelling of your face and throat  . A fast heartbeat  . A bad rash all over your body  . Dizziness and weakness    Immunizations Administered    Name Date Dose VIS Date Route   Pfizer COVID-19 Vaccine 06/29/2019 11:25 AM 0.3 mL 04/25/2019 Intramuscular   Manufacturer: [place]   Lot: [id number]   [place]: [id number]

## 2019-07-14 ENCOUNTER — Other Ambulatory Visit: Payer: Self-pay

## 2019-07-14 DIAGNOSIS — N400 Enlarged prostate without lower urinary tract symptoms: Secondary | ICD-10-CM

## 2019-07-14 NOTE — Telephone Encounter (Signed)
[place] for one month refill  Please to make ROV for further refills

## 2019-07-14 NOTE — Telephone Encounter (Signed)
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 Patient is scheduled to come next week, but is needing medication to last until visit. Could a supply please be sent to the pharmacy? Advised that he would need to keep appointment to get any further refills**   1.Medication Requested:  tamsulosin (FLOMAX) 0.4 MG CAPS capsule   2. Pharmacy (Name, Street, [place]):  [facility], [place]  3. On Med List:   yes  4. Last Visit with PCP:   04/29/2018  5. Next visit date with PCP:  07/22/2019

## 2019-07-14 NOTE — Telephone Encounter (Signed)
Please advise 

## 2019-07-15 MED ORDER — TAMSULOSIN HCL 0.4 MG PO CAPS: 0.4000 mg | ORAL_CAPSULE | Freq: Every day | ORAL | 0 refills | Status: DC

## 2019-07-15 NOTE — Telephone Encounter (Signed)
Erx for 1 month sent.

## 2019-07-22 ENCOUNTER — Encounter: Payer: Self-pay | Admitting: Internal Medicine

## 2019-07-22 ENCOUNTER — Ambulatory Visit (INDEPENDENT_AMBULATORY_CARE_PROVIDER_SITE_OTHER): Payer: Medicare HMO | Admitting: Internal Medicine

## 2019-07-22 ENCOUNTER — Other Ambulatory Visit: Payer: Self-pay

## 2019-07-22 ENCOUNTER — Ambulatory Visit: Payer: Medicare HMO | Attending: Internal Medicine

## 2019-07-22 VITALS — BP 122/74 | HR 82 | Temp 98.6°F | Ht 65.0 in | Wt 158.2 lb

## 2019-07-22 DIAGNOSIS — Z0000 Encounter for general adult medical examination without abnormal findings: Secondary | ICD-10-CM | POA: Diagnosis not present

## 2019-07-22 DIAGNOSIS — E538 Deficiency of other specified B group vitamins: Secondary | ICD-10-CM | POA: Diagnosis not present

## 2019-07-22 DIAGNOSIS — E559 Vitamin D deficiency, unspecified: Secondary | ICD-10-CM | POA: Diagnosis not present

## 2019-07-22 DIAGNOSIS — E119 Type 2 diabetes mellitus without complications: Secondary | ICD-10-CM | POA: Diagnosis not present

## 2019-07-22 DIAGNOSIS — Z23 Encounter for immunization: Secondary | ICD-10-CM

## 2019-07-22 DIAGNOSIS — E291 Testicular hypofunction: Secondary | ICD-10-CM

## 2019-07-22 DIAGNOSIS — E611 Iron deficiency: Secondary | ICD-10-CM | POA: Diagnosis not present

## 2019-07-22 LAB — CBC WITH DIFFERENTIAL/PLATELET
Basophils Absolute: 0.1 10*3/uL (ref 0.0–0.1)
Basophils Relative: 0.7 % (ref 0.0–3.0)
Eosinophils Absolute: 0.7 10*3/uL (ref 0.0–0.7)
Eosinophils Relative: 7.8 % — ABNORMAL HIGH (ref 0.0–5.0)
HCT: 43.8 % (ref 39.0–52.0)
Hemoglobin: 14.8 g/dL (ref 13.0–17.0)
Lymphocytes Relative: 24 % (ref 12.0–46.0)
Lymphs Abs: 2.2 10*3/uL (ref 0.7–4.0)
MCHC: 33.7 g/dL (ref 30.0–36.0)
MCV: 91.2 fl (ref 78.0–100.0)
Monocytes Absolute: 0.6 10*3/uL (ref 0.1–1.0)
Monocytes Relative: 7 % (ref 3.0–12.0)
Neutro Abs: 5.6 10*3/uL (ref 1.4–7.7)
Neutrophils Relative %: 60.5 % (ref 43.0–77.0)
Platelets: 228 10*3/uL (ref 150.0–400.0)
RBC: 4.81 Mil/uL (ref 4.22–5.81)
RDW: 14.2 % (ref 11.5–15.5)
WBC: 9.2 10*3/uL (ref 4.0–10.5)

## 2019-07-22 LAB — LIPID PANEL
Cholesterol: 129 mg/dL (ref 0–200)
HDL: 44.2 mg/dL (ref >39.00)
LDL Cholesterol: 74 mg/dL (ref 0–99)
NonHDL: 84.51
Total CHOL/HDL Ratio: 3
Triglycerides: 52 mg/dL (ref 0.0–149.0)
VLDL: 10.4 mg/dL (ref 0.0–40.0)

## 2019-07-22 LAB — HEPATIC FUNCTION PANEL
ALT: 14 U/L (ref 0–53)
AST: 14 U/L (ref 0–37)
Albumin: 3.9 g/dL (ref 3.5–5.2)
Alkaline Phosphatase: 70 U/L (ref 39–117)
Bilirubin, Direct: 0.1 mg/dL (ref 0.0–0.3)
Total Bilirubin: 0.4 mg/dL (ref 0.2–1.2)
Total Protein: 6.5 g/dL (ref 6.0–8.3)

## 2019-07-22 LAB — URINALYSIS, ROUTINE W REFLEX MICROSCOPIC
Bilirubin Urine: NEGATIVE
Hgb urine dipstick: NEGATIVE
Ketones, ur: NEGATIVE
Leukocytes,Ua: NEGATIVE
Nitrite: NEGATIVE
RBC / HPF: NONE SEEN (ref 0–?)
Specific Gravity, Urine: 1.025 (ref 1.000–1.030)
Total Protein, Urine: NEGATIVE
Urine Glucose: NEGATIVE
Urobilinogen, UA: 0.2 (ref 0.0–1.0)
WBC, UA: NONE SEEN (ref 0–?)
pH: 6 (ref 5.0–8.0)

## 2019-07-22 LAB — MICROALBUMIN / CREATININE URINE RATIO
Creatinine,U: 127 mg/dL
Microalb Creat Ratio: 0.6 mg/g (ref 0.0–30.0)
Microalb, Ur: 0.8 mg/dL (ref 0.0–1.9)

## 2019-07-22 LAB — BASIC METABOLIC PANEL
BUN: 15 mg/dL (ref 6–23)
CO2: 27 mEq/L (ref 19–32)
Calcium: 9.3 mg/dL (ref 8.4–10.5)
Chloride: 108 mEq/L (ref 96–112)
Creatinine, Ser: 0.99 mg/dL (ref 0.40–1.50)
GFR: 90.86 mL/min (ref >60.00)
Glucose, Bld: 83 mg/dL (ref 70–99)
Potassium: 4.5 mEq/L (ref 3.5–5.1)
Sodium: 139 mEq/L (ref 135–145)

## 2019-07-22 LAB — IBC PANEL
Iron: 107 ug/dL (ref 42–165)
Saturation Ratios: 42.7 % (ref 20.0–50.0)
Transferrin: 179 mg/dL — ABNORMAL LOW (ref 212.0–360.0)

## 2019-07-22 LAB — TSH: TSH: 0.43 u[IU]/mL (ref 0.35–4.50)

## 2019-07-22 LAB — HEMOGLOBIN A1C: Hgb A1c MFr Bld: 6 % (ref 4.6–6.5)

## 2019-07-22 LAB — PSA: PSA: 3.95 ng/mL (ref 0.10–4.00)

## 2019-07-22 LAB — VITAMIN B12: Vitamin B-12: 549 pg/mL (ref 211–911)

## 2019-07-22 LAB — TESTOSTERONE: Testosterone: 236.74 ng/dL — ABNORMAL LOW (ref 300.00–890.00)

## 2019-07-22 LAB — VITAMIN D 25 HYDROXY (VIT D DEFICIENCY, FRACTURES): VITD: 29.2 ng/mL — ABNORMAL LOW (ref 30.00–100.00)

## 2019-07-22 NOTE — Patient Instructions (Signed)
Please continue all other medications as before, and refills have been done if requested.  Please have the pharmacy call with any other refills you may need.  Please continue your efforts at being more active, low cholesterol diet, and weight control.  You are otherwise up to date with prevention measures today.  Please keep your appointments with your specialists as you may have planned  You will be contacted regarding the referral for: foot doctor (podiatry) and eye doctor  Please go to the LAB at the blood drawing area for the tests to be done  You will be contacted by phone if any changes need to be made immediately.  Otherwise, you will receive a letter about your results with an explanation, but please check with MyChart first.  Please remember to sign up for MyChart if you have not done so, as this will be important to you in the future with finding out test results, communicating by private email, and scheduling acute appointments online when needed.  Please make an Appointment to return in 6 months, or sooner if needed

## 2019-07-22 NOTE — Progress Notes (Signed)
Subjective:    Patient ID: Michael Mcmillan, male    DOB: [birth year], 69 y.o.   MRN: [id number]  HPI  Here for wellness and f/u;  Overall doing ok;  Pt denies Chest pain, worsening SOB, DOE, wheezing, orthopnea, PND, worsening LE edema, palpitations, dizziness or syncope.  Pt denies neurological change such as new headache, facial or extremity weakness.  Pt denies polydipsia, polyuria, or low sugar symptoms. Pt states overall good compliance with treatment and medications, good tolerability, and has been trying to follow appropriate diet.  Pt denies worsening depressive symptoms, suicidal ideation or panic. No fever, night sweats, wt loss, loss of appetite, or other constitutional symptoms.  Pt states good ability with ADL's, has low fall risk, home safety reviewed and adequate, no other significant changes in hearing or vision, and only occasionally active with exercise. Has worsening ED over last few months Past Medical History:  Diagnosis Date  . Diabetes mellitus without complication ([place])   . Erectile dysfunction   . GERD (gastroesophageal reflux disease)   . Hypogonadism in male    Past Surgical History:  Procedure Laterality Date  . right arm torn ligaments Right approx 1985  . TONSILLECTOMY      reports that he has quit smoking. He has quit using smokeless tobacco. He reports that he does not drink alcohol or use drugs. family history includes Bone cancer in his mother; Colon cancer in his father. Allergies  Allergen Reactions  . Metformin And Related Diarrhea   Current Outpatient Medications on File Prior to Visit  Medication Sig Dispense Refill  . ACCU-CHEK SOFTCLIX LANCETS lancets Use to help check blood sugars twice a day Dx E11.9 300 each 3  . aspirin EC 81 MG tablet Take 1 tablet (81 mg total) by mouth daily. 90 tablet 11  . Blood Glucose Monitoring Suppl (ACCU-CHEK AVIVA PLUS) w/Device KIT Use to check blood sugars twice a day. Dx E11.9 1 kit 0  . celecoxib (CELEBREX)  200 MG capsule Take 1 capsule (200 mg total) by mouth 2 (two) times daily as needed. 180 capsule 3  . cholecalciferol (VITAMIN D) 1000 units tablet Take 1,000 Units by mouth daily.    Marland Kitchen docusate sodium (COLACE) 100 MG capsule Take 1 capsule (100 mg total) by mouth 2 (two) times daily. 60 capsule 5  . gabapentin (NEURONTIN) 100 MG capsule Take 1 capsule (100 mg total) by mouth 3 (three) times daily. 270 capsule 1  . glipiZIDE (GLUCOTROL XL) 10 MG 24 hr tablet TAKE 1 TABLET  DAILY WITH BREAKFAST. 90 tablet 2  . glucose blood (ACCU-CHEK AVIVA PLUS) test strip 1 each by Other route 2 (two) times daily. Use to check blood sugars twice a day Dx E11.9 300 each 3  . glucose blood test strip 1 each by Other route 2 (two) times daily. Use as instructed 200 each 11  . ONE TOUCH LANCETS MISC Use as directed twice per day 200 each 11  . pantoprazole (PROTONIX) 40 MG tablet Take 1 tablet (40 mg total) by mouth daily. 90 tablet 3  . pioglitazone (ACTOS) 45 MG tablet TAKE 1 TABLET EVERY DAY 90 tablet 2  . Semaglutide, 1 MG/DOSE, (OZEMPIC, 1 MG/DOSE,) 2 MG/1.5ML SOPN Inject 1 mg into the skin once a week. 6 pen 3  . sertraline (ZOLOFT) 100 MG tablet TAKE 1 TABLET EVERY DAY 90 tablet 3  . tamsulosin (FLOMAX) 0.4 MG CAPS capsule Take 1 capsule (0.4 mg total) by mouth daily. 30 capsule 0  No current facility-administered medications on file prior to visit.   Review of Systems All otherwise neg per pt     Objective:   Physical Exam BP 122/74   Pulse 82   Temp 98.6 F (37 C)   Ht _0  (1.651 m)   Wt 158 lb 3.2 oz (71.8 kg)   SpO2 99%   BMI 26.33 kg/m  VS noted,  Constitutional: Pt appears in NAD HENT: Head: NCAT.  Right Ear: External ear normal.  Left Ear: External ear normal.  Eyes: . Pupils are equal, round, and reactive to light. Conjunctivae and EOM are normal Nose: without d/c or deformity Neck: Neck supple. Gross normal ROM Cardiovascular: Normal rate and regular rhythm.   Pulmonary/Chest:  Effort normal and breath sounds without rales or wheezing.  Abd:  Soft, NT, ND, + BS, no organomegaly Neurological: Pt is alert. At baseline orientation, motor grossly intact Skin: Skin is warm. No rashes, other new lesions, no LE edema Psychiatric: Pt behavior is normal without agitation  All otherwise neg per pt Lab Results  Component Value Date   WBC 9.2 07/22/2019   HGB 14.8 07/22/2019   HCT 43.8 07/22/2019   PLT 228.0 07/22/2019   GLUCOSE 83 07/22/2019   CHOL 129 07/22/2019   TRIG 52.0 07/22/2019   HDL 44.20 07/22/2019   LDLCALC 74 07/22/2019   ALT 14 07/22/2019   AST 14 07/22/2019   NA 139 07/22/2019   K 4.5 07/22/2019   CL 108 07/22/2019   CREATININE 0.99 07/22/2019   BUN 15 07/22/2019   CO2 27 07/22/2019   TSH 0.43 07/22/2019   PSA 3.95 07/22/2019   HGBA1C 6.0 07/22/2019   MICROALBUR 0.8 07/22/2019      Assessment & Plan:

## 2019-07-22 NOTE — Assessment & Plan Note (Signed)
stable overall by history and exam, recent data reviewed with pt, and pt to continue medical treatment as before,  to f/u any worsening symptoms or concerns  

## 2019-07-22 NOTE — Assessment & Plan Note (Signed)

## 2019-07-22 NOTE — Progress Notes (Signed)
   Covid-19 Vaccination Clinic  Name:  NILE DRABEK    MRN: [id number] DOB: [birth year]  07/22/2019  Mr. Vanotterloo was observed post Covid-19 immunization for 15 minutes without incident. He was provided with Vaccine Information Sheet and instruction to access the V-Safe system.   Mr. Jopp was instructed to call 911 with any severe reactions post vaccine: Marland Kitchen Difficulty breathing  . Swelling of face and throat  . A fast heartbeat  . A bad rash all over body  . Dizziness and weakness   Immunizations Administered    Name Date Dose VIS Date Route   Pfizer COVID-19 Vaccine 07/22/2019  2:33 PM 0.3 mL 04/25/2019 Intramuscular   Manufacturer: [place]   Lot: [id number]   [place]: [id number]

## 2019-07-22 NOTE — Assessment & Plan Note (Signed)
For testosterone level 

## 2019-07-29 ENCOUNTER — Encounter: Payer: Self-pay | Admitting: Internal Medicine

## 2019-08-12 ENCOUNTER — Other Ambulatory Visit: Payer: Self-pay

## 2019-08-12 ENCOUNTER — Ambulatory Visit (INDEPENDENT_AMBULATORY_CARE_PROVIDER_SITE_OTHER): Payer: Medicare HMO | Admitting: Podiatry

## 2019-08-12 DIAGNOSIS — G8929 Other chronic pain: Secondary | ICD-10-CM

## 2019-08-12 DIAGNOSIS — E119 Type 2 diabetes mellitus without complications: Secondary | ICD-10-CM | POA: Diagnosis not present

## 2019-08-12 DIAGNOSIS — M25571 Pain in right ankle and joints of right foot: Secondary | ICD-10-CM | POA: Diagnosis not present

## 2019-08-12 DIAGNOSIS — Q828 Other specified congenital malformations of skin: Secondary | ICD-10-CM | POA: Diagnosis not present

## 2019-08-12 DIAGNOSIS — R21 Rash and other nonspecific skin eruption: Secondary | ICD-10-CM | POA: Diagnosis not present

## 2019-08-12 MED ORDER — TRIAMCINOLONE ACETONIDE 0.025 % EX OINT: 1.0000 "application " | TOPICAL_OINTMENT | Freq: Two times a day (BID) | CUTANEOUS | 0 refills | Status: DC

## 2019-08-18 ENCOUNTER — Telehealth: Payer: Self-pay | Admitting: Internal Medicine

## 2019-08-18 ENCOUNTER — Other Ambulatory Visit: Payer: Self-pay | Admitting: Internal Medicine

## 2019-08-18 DIAGNOSIS — N400 Enlarged prostate without lower urinary tract symptoms: Secondary | ICD-10-CM

## 2019-08-18 DIAGNOSIS — F32A Depression, unspecified: Secondary | ICD-10-CM

## 2019-08-18 DIAGNOSIS — F329 Major depressive disorder, single episode, unspecified: Secondary | ICD-10-CM

## 2019-08-18 DIAGNOSIS — E1110 Type 2 diabetes mellitus with ketoacidosis without coma: Secondary | ICD-10-CM

## 2019-08-18 DIAGNOSIS — E119 Type 2 diabetes mellitus without complications: Secondary | ICD-10-CM

## 2019-08-18 MED ORDER — TAMSULOSIN HCL 0.4 MG PO CAPS: 0.4000 mg | ORAL_CAPSULE | Freq: Every day | ORAL | 0 refills | Status: DC

## 2019-08-18 MED ORDER — PIOGLITAZONE HCL 45 MG PO TABS: 45.0000 mg | ORAL_TABLET | Freq: Every day | ORAL | 2 refills | Status: DC

## 2019-08-18 MED ORDER — GLIPIZIDE ER 10 MG PO TB24: 10.0000 mg | ORAL_TABLET | Freq: Every day | ORAL | 2 refills | Status: DC

## 2019-08-18 NOTE — Progress Notes (Signed)
Subjective:   Patient ID: Michael Mcmillan, male   DOB: 69 y.o.   MRN: [id number]   HPI 69 year old male presents the office today for concerns of a rash present to the left lateral ankle which not about 6 months.  Start over-the-counter medication but any significant provement.  He states he got this from a nail salon.  No drainage.  No open sore.  Also has a callus on the right foot submetatarsal 5.  This area is painful with pressure in shoes.  No recent injury to his feet or ankles.  He occasionally gets some right ankle pain with driving.  No pain today no recent injury.  He is diabetic and his last A1c was 6.0.   Review of Systems  All other systems reviewed and are negative.  Past Medical History:  Diagnosis Date  . Diabetes mellitus without complication ([place])   . Erectile dysfunction   . GERD (gastroesophageal reflux disease)   . Hypogonadism in male     Past Surgical History:  Procedure Laterality Date  . right arm torn ligaments Right approx 1985  . TONSILLECTOMY       Current Outpatient Medications:  .  ACCU-CHEK SOFTCLIX LANCETS lancets, Use to help check blood sugars twice a day Dx E11.9, Disp: 300 each, Rfl: 3 .  aspirin EC 81 MG tablet, Take 1 tablet (81 mg total) by mouth daily., Disp: 90 tablet, Rfl: 11 .  Blood Glucose Monitoring Suppl (ACCU-CHEK AVIVA PLUS) w/Device KIT, Use to check blood sugars twice a day. Dx E11.9, Disp: 1 kit, Rfl: 0 .  celecoxib (CELEBREX) 200 MG capsule, Take 1 capsule (200 mg total) by mouth 2 (two) times daily as needed., Disp: 180 capsule, Rfl: 3 .  cholecalciferol (VITAMIN D) 1000 units tablet, Take 1,000 Units by mouth daily., Disp: , Rfl:  .  docusate sodium (COLACE) 100 MG capsule, Take 1 capsule (100 mg total) by mouth 2 (two) times daily., Disp: 60 capsule, Rfl: 5 .  gabapentin (NEURONTIN) 100 MG capsule, Take 1 capsule (100 mg total) by mouth 3 (three) times daily., Disp: 270 capsule, Rfl: 1 .  glipiZIDE (GLUCOTROL XL) 10 MG 24 hr  tablet, TAKE 1 TABLET  DAILY WITH BREAKFAST., Disp: 90 tablet, Rfl: 2 .  glucose blood (ACCU-CHEK AVIVA PLUS) test strip, 1 each by Other route 2 (two) times daily. Use to check blood sugars twice a day Dx E11.9, Disp: 300 each, Rfl: 3 .  glucose blood test strip, 1 each by Other route 2 (two) times daily. Use as instructed, Disp: 200 each, Rfl: 11 .  ONE TOUCH LANCETS MISC, Use as directed twice per day, Disp: 200 each, Rfl: 11 .  pantoprazole (PROTONIX) 40 MG tablet, Take 1 tablet (40 mg total) by mouth daily., Disp: 90 tablet, Rfl: 3 .  pioglitazone (ACTOS) 45 MG tablet, TAKE 1 TABLET EVERY DAY, Disp: 90 tablet, Rfl: 2 .  Semaglutide, 1 MG/DOSE, (OZEMPIC, 1 MG/DOSE,) 2 MG/1.5ML SOPN, Inject 1 mg into the skin once a week., Disp: 6 pen, Rfl: 3 .  sertraline (ZOLOFT) 100 MG tablet, TAKE 1 TABLET EVERY DAY, Disp: 90 tablet, Rfl: 3 .  tamsulosin (FLOMAX) 0.4 MG CAPS capsule, Take 1 capsule (0.4 mg total) by mouth daily., Disp: 30 capsule, Rfl: 0 .  triamcinolone (KENALOG) 0.025 % ointment, Apply 1 application topically 2 (two) times daily., Disp: 30 g, Rfl: 0  Allergies  Allergen Reactions  . Metformin And Related Diarrhea  Objective:  Physical Exam  General: AAO x3, NAD  Dermatological: The lateral aspect the left ankle is a dry, scaly, erythematous rash which subjectively does not itch.  There is no drainage or possibly open sores.  No blister formation.  Hyperkeratotic lesion right foot submetatarsal 5 without any underlying ulceration drainage or any signs of infection.  No other open lesions or preulcerative lesions.  Vascular: Dorsalis Pedis artery and Posterior Tibial artery pedal pulses are 2/4 bilateral with immedate capillary fill time. There is no pain with calf compression, swelling, warmth, erythema.   Neruologic: Grossly intact via light touch bilateral. Protective threshold with Semmes Wienstein monofilament intact to all pedal sites bilateral.  Musculoskeletal:  Subjectively there is tenderness palpation to the right ankle but there is no area of tenderness identified today.  There is no edema, erythema.  Flexor, extensor tendons appear to be intact.  Muscular strength 5/5 in all groups tested bilateral.  Gait: Unassisted, Nonantalgic.       Assessment:   Hyperkeratotic lesion right foot, skin rash left ankle; intermittent right ankle pain    Plan:  -Treatment options discussed including all alternatives, risks, and complications -Etiology of symptoms were discussed -Prescribed triamcinolone cream for left ankle -Debrided hyperkeratotic lesion right foot without any complications or bleeding.  Moisturizer daily and I discussed offloading. -Dispensed ankle brace to use as needed for the right ankle.  He wants to get a Tri-Lock which was dispensed today.  He is having no pain currently no recent injury.  I held off on x-rays but if symptoms continue will order x-rays. -Discussed the importance of daily foot inspection  Return in about 6 weeks (around 09/23/2019).  Trula Slade DPM

## 2019-08-18 NOTE — Telephone Encounter (Signed)
Sent requested medications to [facility]

## 2019-08-18 NOTE — Telephone Encounter (Signed)
Please refill as per office routine med refill policy (all routine meds refilled for 3 mo or monthly per pt preference up to one year from last visit, then month to month grace period for 3 mo, then further med refills will have to be denied)  

## 2019-08-18 NOTE — Telephone Encounter (Signed)
Patient is requesting a refill on the following medications glipiZIDE (GLUCOTROL XL) 10 MG 24 hr tablet  tamsulosin (FLOMAX) 0.4 MG CAPS capsule  pioglitazone (ACTOS) 45 MG tablet   Michael Mcmillan has changed pharmacy and the medication now need to be sent too.  [place], [place] [employer] Phone:  [phone]  Fax:  [phone]

## 2019-09-08 ENCOUNTER — Telehealth: Payer: Self-pay

## 2019-09-08 ENCOUNTER — Telehealth: Payer: Self-pay | Admitting: Internal Medicine

## 2019-09-08 ENCOUNTER — Other Ambulatory Visit: Payer: Self-pay

## 2019-09-08 DIAGNOSIS — E119 Type 2 diabetes mellitus without complications: Secondary | ICD-10-CM

## 2019-09-08 MED ORDER — OZEMPIC (1 MG/DOSE) 2 MG/1.5ML ~~LOC~~ SOPN: 1.0000 mg | PEN_INJECTOR | SUBCUTANEOUS | 3 refills | Status: DC

## 2019-09-08 NOTE — Telephone Encounter (Signed)
    1.Medication Requested: Semaglutide, 1 MG/DOSE, (OZEMPIC, 1 MG/DOSE,) 2 MG/1.5ML SOPN  2. Pharmacy (Name, [facility], [place]): [facility]  3. On Med List: yes  4. Last Visit with PCP:   5. Next visit date with PCP:   Agent: Please be advised that RX refills may take up to 3 business days. We ask that you follow-up with your pharmacy.

## 2019-09-08 NOTE — Telephone Encounter (Signed)
New message    The wife calling wants Semaglutide, 1 MG/DOSE, (OZEMPIC, 1 MG/DOSE,) 2 MG/1.5ML SOPN switch to generic brand    [place], [place]

## 2019-09-09 ENCOUNTER — Telehealth: Payer: Self-pay | Admitting: Internal Medicine

## 2019-09-09 NOTE — Telephone Encounter (Signed)
I would happy to send, but as there are several other similar, please ask pt to call [facility] to find out which one that is similar is covered, then we can send.  thanks

## 2019-09-09 NOTE — Telephone Encounter (Signed)
Spoke with pt wife and informed her of Dr. Judi Cong note. She understood and will contact us back with medication name for new rx to be sent for it.

## 2019-09-09 NOTE — Telephone Encounter (Signed)
New message:    Pt's wife is calling and asking if there is another medication like Semaglutide, 1 MG/DOSE, (OZEMPIC, 1 MG/DOSE,) 2 MG/1.5ML SOPN but less expensive for the patient. If so she states it needs to [facility]. Please advise.

## 2019-09-09 NOTE — Telephone Encounter (Signed)
New Message:   Pt's wife is calling and states there are 4 medications that are the same as ozempic. She states she wants the cheapest one.  Tr[facility]rlcity Byetta Bydureon Victoza

## 2019-09-10 MED ORDER — TRULICITY 0.75 MG/0.5ML ~~LOC~~ SOAJ: 0.7500 mg | SUBCUTANEOUS | 3 refills | Status: DC

## 2019-09-10 NOTE — Telephone Encounter (Signed)
[place] trulicity sent [facility]

## 2019-09-10 NOTE — Telephone Encounter (Signed)
F/u  The patient wife is asking for the CMA to call her to discuss yesterday messages

## 2019-09-11 NOTE — Telephone Encounter (Signed)
Left vm with information on med being sent to Pharmacy. ** Dr. Jenny Reichmann has sent over Trulicity to [facility]

## 2019-09-17 ENCOUNTER — Telehealth: Payer: Self-pay | Admitting: Internal Medicine

## 2019-09-17 DIAGNOSIS — E119 Type 2 diabetes mellitus without complications: Secondary | ICD-10-CM

## 2019-09-17 MED ORDER — OZEMPIC (1 MG/DOSE) 2 MG/1.5ML ~~LOC~~ SOPN: 1.0000 mg | PEN_INJECTOR | SUBCUTANEOUS | 3 refills | Status: DC

## 2019-09-17 NOTE — Telephone Encounter (Signed)
Ok [facility]

## 2019-09-17 NOTE — Telephone Encounter (Signed)
New message:   Pt's wife is calling and states they need to change the pt's Dulaglutide (TRULICITY) [id number] SOPNback to ozempic instead of the trulicity. She states the pt is going to be enrolling into a program that will pay for this medication. Please advise.

## 2019-09-17 NOTE — Telephone Encounter (Signed)
Notified pt wife with MD response../lmb ?

## 2019-09-19 ENCOUNTER — Telehealth: Payer: Self-pay

## 2019-09-19 NOTE — Telephone Encounter (Signed)
New message   Phamarmcy needs clarificaiton on medication   Dulaglutide (TRULICITY) [id number] SOPN - sent on 4.28.21  Semaglutide, 1 MG/DOSE, (OZEMPIC, 1 MG/DOSE,) 2 MG/1.5ML SOPN- sent on 5.5.21  The cost for both would be over $ 600.00

## 2019-09-22 NOTE — Telephone Encounter (Signed)
Spoke with [facility] and informed them that pt is to be on Trulicity per Pts preference per his wife.  **Pt wife states pt is enrolled in a program to cover the Trulicity Rx now so no need for the cheaper medication which was the Ozempic.

## 2019-09-22 NOTE — Telephone Encounter (Signed)
   [facility] calling to clarify if patient should be taking both Trulicity and Ozempic

## 2019-09-24 ENCOUNTER — Other Ambulatory Visit: Payer: Self-pay

## 2019-09-24 DIAGNOSIS — N400 Enlarged prostate without lower urinary tract symptoms: Secondary | ICD-10-CM

## 2019-09-24 MED ORDER — TAMSULOSIN HCL 0.4 MG PO CAPS: 0.4000 mg | ORAL_CAPSULE | Freq: Every day | ORAL | 5 refills | Status: DC

## 2019-09-29 ENCOUNTER — Ambulatory Visit: Payer: Medicare HMO | Admitting: Podiatry

## 2019-10-27 DIAGNOSIS — E119 Type 2 diabetes mellitus without complications: Secondary | ICD-10-CM | POA: Diagnosis not present

## 2019-10-27 DIAGNOSIS — H524 Presbyopia: Secondary | ICD-10-CM | POA: Diagnosis not present

## 2019-10-27 DIAGNOSIS — H25813 Combined forms of age-related cataract, bilateral: Secondary | ICD-10-CM | POA: Diagnosis not present

## 2019-10-27 DIAGNOSIS — H52203 Unspecified astigmatism, bilateral: Secondary | ICD-10-CM | POA: Diagnosis not present

## 2019-11-19 ENCOUNTER — Telehealth: Payer: Self-pay | Admitting: Internal Medicine

## 2019-11-19 NOTE — Telephone Encounter (Signed)
   [facility] calling to request prior auth for Omeprazole No listed on current med list

## 2019-11-26 DIAGNOSIS — H25011 Cortical age-related cataract, right eye: Secondary | ICD-10-CM | POA: Diagnosis not present

## 2019-11-26 DIAGNOSIS — H25812 Combined forms of age-related cataract, left eye: Secondary | ICD-10-CM | POA: Diagnosis not present

## 2019-11-26 DIAGNOSIS — H2511 Age-related nuclear cataract, right eye: Secondary | ICD-10-CM | POA: Diagnosis not present

## 2019-12-10 DIAGNOSIS — H2512 Age-related nuclear cataract, left eye: Secondary | ICD-10-CM | POA: Diagnosis not present

## 2019-12-10 DIAGNOSIS — H25012 Cortical age-related cataract, left eye: Secondary | ICD-10-CM | POA: Diagnosis not present

## 2020-01-05 DIAGNOSIS — Z961 Presence of intraocular lens: Secondary | ICD-10-CM | POA: Diagnosis not present

## 2020-01-22 ENCOUNTER — Ambulatory Visit: Payer: Medicare HMO | Admitting: Internal Medicine

## 2020-02-16 ENCOUNTER — Ambulatory Visit (INDEPENDENT_AMBULATORY_CARE_PROVIDER_SITE_OTHER): Payer: Medicare Other

## 2020-02-16 ENCOUNTER — Encounter: Payer: Medicare Other | Admitting: Internal Medicine

## 2020-02-16 ENCOUNTER — Other Ambulatory Visit: Payer: Self-pay

## 2020-02-16 VITALS — BP 120/70 | HR 89 | Temp 98.1°F | Resp 16 | Ht 65.0 in | Wt 162.0 lb

## 2020-02-16 DIAGNOSIS — Z0000 Encounter for general adult medical examination without abnormal findings: Secondary | ICD-10-CM | POA: Diagnosis not present

## 2020-02-16 NOTE — Patient Instructions (Addendum)
Michael Mcmillan , Thank you for taking time to come for your Medicare Wellness Visit. I appreciate your ongoing commitment to your health goals. Please review the following plan we discussed and let me know if I can assist you in the future.   Screening recommendations/referrals: Colonoscopy: 07/10/2014; due every 10 years Recommended yearly ophthalmology/optometry visit for glaucoma screening and checkup Recommended yearly dental visit for hygiene and checkup  Vaccinations: Influenza vaccine: 03/03/2019 (will get at physical 03/08/2020) Pneumococcal vaccine: completed Tdap vaccine: 05/07/2014; due every 10 years Shingles vaccine: never done (pamphlet provided) Covid-19: completed (also received booster on 03/13/2020)  Advanced directives: Advance directive discussed with you today. Even though you declined this today please call our office should you change your mind and we can give you the proper paperwork for you to fill out.  Conditions/risks identified: Yes; Reviewed health maintenance screenings with patient today and relevant education, vaccines, and/or referrals were provided. Please continue to do your personal lifestyle choices by: daily care of teeth and gums, regular physical activity (goal should be 5 days a week for 30 minutes), eat a healthy diet, avoid tobacco and drug use, limiting any alcohol intake, taking a low-dose aspirin (if not allergic or have been advised by your provider otherwise) and taking vitamins and minerals as recommended by your provider. Continue doing brain stimulating activities (puzzles, reading, adult coloring books, staying active) to keep memory sharp. Continue to eat heart healthy diet (full of fruits, vegetables, whole grains, lean protein, water--limit salt, fat, and sugar intake) and increase physical activity as tolerated.  Next appointment: Please schedule your next Medicare Wellness Visit with your Nurse Health Advisor in 1 year by calling  [phone]. Preventive Care 30 Years and Older, Male Preventive care refers to lifestyle choices and visits with your health care provider that can promote health and wellness. What does preventive care include?  A yearly physical exam. This is also called an annual well check.  Dental exams once or twice a year.  Routine eye exams. Ask your health care provider how often you should have your eyes checked.  Personal lifestyle choices, including:  Daily care of your teeth and gums.  Regular physical activity.  Eating a healthy diet.  Avoiding tobacco and drug use.  Limiting alcohol use.  Practicing safe sex.  Taking low doses of aspirin every day.  Taking vitamin and mineral supplements as recommended by your health care provider. What happens during an annual well check? The services and screenings done by your health care provider during your annual well check will depend on your age, overall health, lifestyle risk factors, and family history of disease. Counseling  Your health care provider may ask you questions about your:  Alcohol use.  Tobacco use.  Drug use.  Emotional well-being.  Home and relationship well-being.  Sexual activity.  Eating habits.  History of falls.  Memory and ability to understand (cognition).  Work and work Statistician. Screening  You may have the following tests or measurements:  Height, weight, and BMI.  Blood pressure.  Lipid and cholesterol levels. These may be checked every 5 years, or more frequently if you are over 37 years old.  Skin check.  Lung cancer screening. You may have this screening every year starting at age 92 if you have a 30-pack-year history of smoking and currently smoke or have quit within the past 15 years.  Fecal occult blood test (FOBT) of the stool. You may have this test every year starting at age 58.  Flexible sigmoidoscopy or colonoscopy. You may have a sigmoidoscopy every 5 years or a  colonoscopy every 10 years starting at age 3.  Prostate cancer screening. Recommendations will vary depending on your family history and other risks.  Hepatitis C blood test.  Hepatitis B blood test.  Sexually transmitted disease (STD) testing.  Diabetes screening. This is done by checking your blood sugar (glucose) after you have not eaten for a while (fasting). You may have this done every 1-3 years.  Abdominal aortic aneurysm (AAA) screening. You may need this if you are a current or former smoker.  Osteoporosis. You may be screened starting at age 33 if you are at high risk. Talk with your health care provider about your test results, treatment options, and if necessary, the need for more tests. Vaccines  Your health care provider may recommend certain vaccines, such as:  Influenza vaccine. This is recommended every year.  Tetanus, diphtheria, and acellular pertussis (Tdap, Td) vaccine. You may need a Td booster every 10 years.  Zoster vaccine. You may need this after age 38.  Pneumococcal 13-valent conjugate (PCV13) vaccine. One dose is recommended after age 87.  Pneumococcal polysaccharide (PPSV23) vaccine. One dose is recommended after age 36. Talk to your health care provider about which screenings and vaccines you need and how often you need them. This information is not intended to replace advice given to you by your health care provider. Make sure you discuss any questions you have with your health care provider. Document Released: 05/28/2015 Document Revised: 01/19/2016 Document Reviewed: 03/02/2015 Elsevier Interactive Patient Education  2017 [place] Prevention in the Home Falls can cause injuries. They can happen to people of all ages. There are many things you can do to make your home safe and to help prevent falls. What can I do on the outside of my home?  Regularly fix the edges of walkways and driveways and fix any cracks.  Remove anything that  might make you trip as you walk through a door, such as a raised step or threshold.  Trim any bushes or trees on the path to your home.  Use bright outdoor lighting.  Clear any walking paths of anything that might make someone trip, such as rocks or tools.  Regularly check to see if handrails are loose or broken. Make sure that both sides of any steps have handrails.  Any raised decks and porches should have guardrails on the edges.  Have any leaves, snow, or ice cleared regularly.  Use sand or salt on walking paths during winter.  Clean up any spills in your garage right away. This includes oil or grease spills. What can I do in the bathroom?  Use night lights.  Install grab bars by the toilet and in the tub and shower. Do not use towel bars as grab bars.  Use non-skid mats or decals in the tub or shower.  If you need to sit down in the shower, use a plastic, non-slip stool.  Keep the floor dry. Clean up any water that spills on the floor as soon as it happens.  Remove soap buildup in the tub or shower regularly.  Attach bath mats securely with double-sided non-slip rug tape.  Do not have throw rugs and other things on the floor that can make you trip. What can I do in the bedroom?  Use night lights.  Make sure that you have a light by your bed that is easy to reach.  Do  not use any sheets or blankets that are too big for your bed. They should not hang down onto the floor.  Have a firm chair that has side arms. You can use this for support while you get dressed.  Do not have throw rugs and other things on the floor that can make you trip. What can I do in the kitchen?  Clean up any spills right away.  Avoid walking on wet floors.  Keep items that you use a lot in easy-to-reach places.  If you need to reach something above you, use a strong step stool that has a grab bar.  Keep electrical cords out of the way.  Do not use floor polish or wax that makes floors  slippery. If you must use wax, use non-skid floor wax.  Do not have throw rugs and other things on the floor that can make you trip. What can I do with my stairs?  Do not leave any items on the stairs.  Make sure that there are handrails on both sides of the stairs and use them. Fix handrails that are broken or loose. Make sure that handrails are as long as the stairways.  Check any carpeting to make sure that it is firmly attached to the stairs. Fix any carpet that is loose or worn.  Avoid having throw rugs at the top or bottom of the stairs. If you do have throw rugs, attach them to the floor with carpet tape.  Make sure that you have a light switch at the top of the stairs and the bottom of the stairs. If you do not have them, ask someone to add them for you. What else can I do to help prevent falls?  Wear shoes that:  Do not have high heels.  Have rubber bottoms.  Are comfortable and fit you well.  Are closed at the toe. Do not wear sandals.  If you use a stepladder:  Make sure that it is fully opened. Do not climb a closed stepladder.  Make sure that both sides of the stepladder are locked into place.  Ask someone to hold it for you, if possible.  Clearly mark and make sure that you can see:  Any grab bars or handrails.  First and last steps.  Where the edge of each step is.  Use tools that help you move around (mobility aids) if they are needed. These include:  Canes.  Walkers.  Scooters.  Crutches.  Turn on the lights when you go into a dark area. Replace any light bulbs as soon as they burn out.  Set up your furniture so you have a clear path. Avoid moving your furniture around.  If any of your floors are uneven, fix them.  If there are any pets around you, be aware of where they are.  Review your medicines with your doctor. Some medicines can make you feel dizzy. This can increase your chance of falling. Ask your doctor what other things that you  can do to help prevent falls. This information is not intended to replace advice given to you by your health care provider. Make sure you discuss any questions you have with your health care provider. Document Released: 02/25/2009 Document Revised: 10/07/2015 Document Reviewed: 06/05/2014 Elsevier Interactive Patient Education  2017 [employer].

## 2020-02-16 NOTE — Progress Notes (Signed)
Subjective:   Michael Mcmillan is a 69 y.o. male who presents for Medicare Annual/Subsequent preventive examination.  Review of Systems    No ROS. Medicare Wellness Visit. Cardiac Risk Factors include: advanced age (>110mn, >>42women);diabetes mellitus;male gender     Objective:    Today's Vitals   02/16/20 0813  BP: 120/70  Pulse: 89  Resp: 16  Temp: 98.1 F (36.7 C)  SpO2: 98%  Weight: 162 lb (73.5 kg)  Height: '5\' 5"'  (1.651 m)  PainSc: 0-No pain   Body mass index is 26.96 kg/m.  Advanced Directives 02/16/2020 07/10/2014 06/22/2014  Does Patient Have a Medical Advance Directive? No No No  Would patient like information on creating a medical advance directive? No - Patient declined - -    Current Medications (verified) Outpatient Encounter Medications as of 02/16/2020  Medication Sig  . ACCU-CHEK SOFTCLIX LANCETS lancets Use to help check blood sugars twice a day Dx E11.9  . aspirin EC 81 MG tablet Take 1 tablet (81 mg total) by mouth daily.  . Blood Glucose Monitoring Suppl (ACCU-CHEK AVIVA PLUS) w/Device KIT Use to check blood sugars twice a day. Dx E11.9  . celecoxib (CELEBREX) 200 MG capsule Take 1 capsule (200 mg total) by mouth 2 (two) times daily as needed.  . cholecalciferol (VITAMIN D) 1000 units tablet Take 1,000 Units by mouth daily.  .Marland Kitchendocusate sodium (COLACE) 100 MG capsule Take 1 capsule (100 mg total) by mouth 2 (two) times daily.  . Dulaglutide (TRULICITY) 04.28MJG/8.1LXSOPN Inject 0.75 mg into the skin once a week.  . gabapentin (NEURONTIN) 100 MG capsule Take 1 capsule (100 mg total) by mouth 3 (three) times daily.  .Marland KitchenglipiZIDE (GLUCOTROL XL) 10 MG 24 hr tablet Take 1 tablet (10 mg total) by mouth daily with breakfast.  . glucose blood (ACCU-CHEK AVIVA PLUS) test strip 1 each by Other route 2 (two) times daily. Use to check blood sugars twice a day Dx E11.9  . glucose blood test strip 1 each by Other route 2 (two) times daily. Use as instructed  . ONE  TOUCH LANCETS MISC Use as directed twice per day  . pantoprazole (PROTONIX) 40 MG tablet Take 1 tablet (40 mg total) by mouth daily.  . pioglitazone (ACTOS) 45 MG tablet Take 1 tablet (45 mg total) by mouth daily.  . Semaglutide, 1 MG/DOSE, (OZEMPIC, 1 MG/DOSE,) 2 MG/1.5ML SOPN Inject 1 mg into the skin once a week. (Patient not taking: Reported on 02/16/2020)  . sertraline (ZOLOFT) 100 MG tablet TAKE 1 TABLET EVERY DAY  . tamsulosin (FLOMAX) 0.4 MG CAPS capsule Take 1 capsule (0.4 mg total) by mouth daily.  .Marland Kitchentriamcinolone (KENALOG) 0.025 % ointment Apply 1 application topically 2 (two) times daily. (Patient not taking: Reported on 02/16/2020)   No facility-administered encounter medications on file as of 02/16/2020.    Allergies (verified) Metformin and related   History: Past Medical History:  Diagnosis Date  . Diabetes mellitus without complication ([place]   . Erectile dysfunction   . GERD (gastroesophageal reflux disease)   . Hypogonadism in male    Past Surgical History:  Procedure Laterality Date  . right arm torn ligaments Right approx 1985  . TONSILLECTOMY     Family History  Problem Relation Age of Onset  . Colon cancer Father   . Bone cancer Mother    Social History   Socioeconomic History  . Marital status: Married    Spouse name: Not on file  .  Number of children: Not on file  . Years of education: Not on file  . Highest education level: Not on file  Occupational History  . Not on file  Tobacco Use  . Smoking status: Former Research scientist (life sciences)  . Smokeless tobacco: Former Network engineer  . Vaping Use: Never used  Substance and Sexual Activity  . Alcohol use: No    Alcohol/week: 0.0 standard drinks  . Drug use: No  . Sexual activity: Not on file  Other Topics Concern  . Not on file  Social History Narrative  . Not on file   Social Determinants of Health   Financial Resource Strain: Low Risk   . Difficulty of Paying Living Expenses: Not hard at all  Food  Insecurity: No Food Insecurity  . Worried About Charity fundraiser in the Last Year: Never true  . Ran Out of Food in the Last Year: Never true  Transportation Needs: No Transportation Needs  . Lack of Transportation (Medical): No  . Lack of Transportation (Non-Medical): No  Physical Activity:   . Days of Exercise per Week: Not on file  . Minutes of Exercise per Session: Not on file  Stress: No Stress Concern Present  . Feeling of Stress : Not at all  Social Connections: Socially Integrated  . Frequency of Communication with Friends and Family: More than three times a week  . Frequency of Social Gatherings with Friends and Family: More than three times a week  . Attends Religious Services: More than 4 times per year  . Active Member of Clubs or Organizations: Yes  . Attends Archivist Meetings: More than 4 times per year  . Marital Status: Married    Tobacco Counseling Counseling given: No   Clinical Intake:  Pre-visit preparation completed: Yes  Pain : No/denies pain Pain Score: 0-No pain     BMI - recorded: 26.96 Nutritional Status: BMI 25 -29 Overweight Nutritional Risks: None Diabetes: Yes CBG done?: No Did pt. 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 yes  Interpreter Needed?: No  Information entered by :: Lisette Abu, LPN   Activities of Daily Living In your present state of health, do you have any difficulty performing the following activities: 02/16/2020  Hearing? N  Vision? N  Difficulty concentrating or making decisions? N  Walking or climbing stairs? N  Dressing or bathing? N  Preparing Food and eating ? N  Using the Toilet? N  In the past six months, have you accidently leaked urine? N  Do  you have problems with loss of bowel control? N  Managing your Medications? N  Managing your Finances? N  Housekeeping or managing your Housekeeping? N  Some recent data might be hidden    Patient Care Team: Biagio Borg, MD as PCP - General (Internal Medicine)  Indicate any recent Medical Services you may have received from other than [facility] providers in the past year (date may be approximate).     Assessment:   This is a routine wellness examination for Jyles.  Hearing/Vision screen No exam data present  Dietary issues and exercise activities discussed: Current Exercise Habits: The patient has a physically strenuous job, but has no regular exercise apart from work., Exercise  limited by: None identified  Goals   None    Depression Screen PHQ 2/9 Scores 02/16/2020 07/22/2019 04/30/2017 04/27/2016 04/27/2016 05/07/2014  PHQ - 2 Score 0 0 0 '2 1 1  ' PHQ- 9 Score - - 0 - - -    Fall Risk Fall Risk  02/16/2020 07/22/2019 04/30/2017 04/27/2016 04/27/2016  Falls in the past year? 0 0 No No No  Number falls in past yr: 0 - - - -  Injury with Fall? 0 - - - -  Risk for fall due to : No Fall Risks - - - -  Follow up Falls evaluation completed;Education provided - - - -    Any stairs in or around the home? Yes  If so, are there any without handrails? No  Home free of loose throw rugs in walkways, pet beds, electrical cords, etc? Yes  Adequate lighting in your home to reduce risk of falls? Yes   ASSISTIVE DEVICES UTILIZED TO PREVENT FALLS:  Life alert? No  Use of a cane, walker or w/c? No  Grab bars in the bathroom? No  Shower chair or bench in shower? No  Elevated toilet seat or a handicapped toilet? No   TIMED UP AND GO:  Was the test performed? No .  Length of time to ambulate 10 feet: 0 sec.   Gait steady and fast without use of assistive device  Cognitive Function:     6CIT Screen 02/16/2020  What Year? 0 points  What month? 0 points  What time? 0 points  Count back  from 20 0 points  Months in reverse 0 points  Repeat phrase 0 points  Total Score 0    Immunizations Immunization History  Administered Date(s) Administered  . Influenza, High Dose Seasonal PF 04/27/2016, 04/29/2018, 03/03/2019  . Influenza,inj,Quad PF,6+ Mos 05/07/2014  . Influenza-Unspecified 03/15/2017  . PFIZER SARS-COV-2 Vaccination 06/29/2019, 07/22/2019  . Pneumococcal Conjugate-13 05/29/2014  . Pneumococcal Polysaccharide-23 04/27/2016  . Pneumococcal-Unspecified 04/27/2016  . Tdap 05/07/2014    TDAP status: Up to date Flu Vaccine status: Up to date Pneumococcal vaccine status: Up to date Covid-19 vaccine status: Completed vaccines  Qualifies for Shingles Vaccine? Yes   Zostavax completed Yes   Shingrix Completed?: No.    Education has been provided regarding the importance of this vaccine. Patient has been advised to call insurance company to determine out of pocket expense if they have not yet received this vaccine. Advised may also receive vaccine at local pharmacy or Health Dept. Verbalized acceptance and understanding.  Screening Tests Health Maintenance  Topic Date Due  . OPHTHALMOLOGY EXAM  Never done  . INFLUENZA VACCINE  12/14/2019  . HEMOGLOBIN A1C  01/22/2020  . URINE MICROALBUMIN  07/21/2020  . FOOT EXAM  08/11/2020  . TETANUS/TDAP  05/07/2024  . COLONOSCOPY  07/10/2024  . COVID-19 Vaccine  Completed  . Hepatitis C Screening  Completed  . PNA vac Low Risk Adult  Completed    Health Maintenance  Health Maintenance Due  Topic Date Due  . OPHTHALMOLOGY EXAM  Never done  . INFLUENZA VACCINE  12/14/2019  . HEMOGLOBIN A1C  01/22/2020    Colorectal cancer screening: Completed 07/10/2014. Repeat every 10 years  Lung Cancer Screening: (Low Dose CT Chest recommended if Age 54-80 years, 30 pack-year currently smoking OR have quit w/in 15years.) does not qualify.   Lung Cancer Screening Referral: no  Additional Screening:  Hepatitis C Screening: does  qualify; Completed yes  Vision Screening: Recommended annual ophthalmology exams  for early detection of glaucoma and other disorders of the eye. Is the patient up to date with their annual eye exam?  Yes  Who is the provider or what is the name of the office in which the patient attends annual eye exams? Rutherford Guys, MD If pt is not established with a provider, would they like to be referred to a provider to establish care? No .   Dental Screening: Recommended annual dental exams for proper oral hygiene  Community Resource Referral / Chronic Care Management: CRR required this visit?  No   CCM required this visit?  No      Plan:     I have personally reviewed and noted the following in the patient's chart:   . Medical and social history . Use of alcohol, tobacco or illicit drugs  . Current medications and supplements . Functional ability and status . Nutritional status . Physical activity . Advanced directives . List of other physicians . Hospitalizations, surgeries, and ER visits in previous 12 months . Vitals . Screenings to include cognitive, depression, and falls . Referrals and appointments  In addition, I have reviewed and discussed with patient certain preventive protocols, quality metrics, and best practice recommendations. A written personalized care plan for preventive services as well as general preventive health recommendations were provided to patient.     Sheral Flow, LPN   28/08/1322   Nurse Notes: n/a

## 2020-03-01 ENCOUNTER — Other Ambulatory Visit: Payer: Self-pay | Admitting: Internal Medicine

## 2020-03-01 DIAGNOSIS — E119 Type 2 diabetes mellitus without complications: Secondary | ICD-10-CM

## 2020-03-01 NOTE — Telephone Encounter (Signed)
Please refill as per office routine med refill policy (all routine meds refilled for 3 mo or monthly per pt preference up to one year from last visit, then month to month grace period for 3 mo, then further med refills will have to be denied)  

## 2020-03-05 ENCOUNTER — Other Ambulatory Visit: Payer: Self-pay

## 2020-03-08 ENCOUNTER — Encounter: Payer: Self-pay | Admitting: Internal Medicine

## 2020-03-08 ENCOUNTER — Other Ambulatory Visit: Payer: Self-pay

## 2020-03-08 ENCOUNTER — Ambulatory Visit (INDEPENDENT_AMBULATORY_CARE_PROVIDER_SITE_OTHER): Payer: Medicare Other | Admitting: Internal Medicine

## 2020-03-08 VITALS — BP 110/72 | HR 75 | Temp 98.9°F | Ht 65.0 in | Wt 162.0 lb

## 2020-03-08 DIAGNOSIS — N400 Enlarged prostate without lower urinary tract symptoms: Secondary | ICD-10-CM

## 2020-03-08 DIAGNOSIS — Z23 Encounter for immunization: Secondary | ICD-10-CM

## 2020-03-08 DIAGNOSIS — E119 Type 2 diabetes mellitus without complications: Secondary | ICD-10-CM | POA: Diagnosis not present

## 2020-03-08 DIAGNOSIS — F32A Depression, unspecified: Secondary | ICD-10-CM

## 2020-03-08 DIAGNOSIS — N401 Enlarged prostate with lower urinary tract symptoms: Secondary | ICD-10-CM | POA: Diagnosis not present

## 2020-03-08 DIAGNOSIS — E291 Testicular hypofunction: Secondary | ICD-10-CM

## 2020-03-08 DIAGNOSIS — R351 Nocturia: Secondary | ICD-10-CM

## 2020-03-08 MED ORDER — TAMSULOSIN HCL 0.4 MG PO CAPS: 0.4000 mg | ORAL_CAPSULE | Freq: Every day | ORAL | 3 refills | Status: DC

## 2020-03-08 NOTE — Patient Instructions (Addendum)
You had the flu shot today  Please take all new medication as prescribed - the flomax for the prostate  We have discussed the Cardiac CT Score test to measure the calcification level (if any) in your heart arteries.  This test has been ordered in our [place], so please call [facility] directly, as they prefer this, at [phone] to be scheduled.  Please continue all other medications as before, and refills have been done if requested.  Please have the pharmacy call with any other refills you may need.  Please continue your efforts at being more active, low cholesterol diet, and weight control.  Please keep your appointments with your specialists as you may have planned  Please go to the LAB at the blood drawing area for the tests to be done  You will be contacted by phone if any changes need to be made immediately.  Otherwise, you will receive a letter about your results with an explanation, but please check with MyChart first.  Please remember to sign up for MyChart if you have not done so, as this will be important to you in the future with finding out test results, communicating by private email, and scheduling acute appointments online when needed.  Please make an Appointment to return in 6 months, or sooner if needed

## 2020-03-08 NOTE — Progress Notes (Signed)
Subjective:    Patient ID: Michael Mcmillan, male    DOB: 05/26/1950, 69 y.o.   MRN: [id number]  HPI  Here to f/u; overall doing ok,  Pt denies chest pain, increasing sob or doe, wheezing, orthopnea, PND, increased LE swelling, palpitations, dizziness or syncope.  Pt denies new neurological symptoms such as new headache, or facial or extremity weakness or numbness.  Pt denies polydipsia, polyuria, or low sugar episode.  Pt states overall good compliance with meds, mostly trying to follow appropriate diet, with wt overall stable,  but little exercise however Did see nutrition, doing better with diet.  Denies urinary symptoms such as dysuria, frequency, urgency, flank pain, hematuria or n/v, fever, chills.except Has nocturia, asks for med.  Due for flu shot today.  Denies worsening depressive symptoms, suicidal ideation, or panic Past Medical History:  Diagnosis Date  . Diabetes mellitus without complication ([place])   . Erectile dysfunction   . GERD (gastroesophageal reflux disease)   . Hypogonadism in male    Past Surgical History:  Procedure Laterality Date  . right arm torn ligaments Right approx 1985  . TONSILLECTOMY      reports that he has quit smoking. He has quit using smokeless tobacco. He reports that he does not drink alcohol and does not use drugs. family history includes Bone cancer in his mother; Colon cancer in his father. Allergies  Allergen Reactions  . Metformin And Related Diarrhea   Current Outpatient Medications on File Prior to Visit  Medication Sig Dispense Refill  . ACCU-CHEK SOFTCLIX LANCETS lancets Use to help check blood sugars twice a day Dx E11.9 300 each 3  . aspirin EC 81 MG tablet Take 1 tablet (81 mg total) by mouth daily. 90 tablet 11  . Blood Glucose Monitoring Suppl (ACCU-CHEK AVIVA PLUS) w/Device KIT Use to check blood sugars twice a day. Dx E11.9 1 kit 0  . celecoxib (CELEBREX) 200 MG capsule Take 1 capsule (200 mg total) by mouth 2 (two) times daily  as needed. 180 capsule 3  . cholecalciferol (VITAMIN D) 1000 units tablet Take 1,000 Units by mouth daily.    Marland Kitchen docusate sodium (COLACE) 100 MG capsule Take 1 capsule (100 mg total) by mouth 2 (two) times daily. 60 capsule 5  . Dulaglutide (TRULICITY) 3.35 KT/6.2BW SOPN Inject 0.75 mg into the skin once a week. 6 mL 3  . gabapentin (NEURONTIN) 100 MG capsule Take 1 capsule (100 mg total) by mouth 3 (three) times daily. 270 capsule 1  . glipiZIDE (GLUCOTROL XL) 10 MG 24 hr tablet TAKE 1 TABLET  DAILY WITH BREAKFAST 90 tablet 2  . glucose blood (ACCU-CHEK AVIVA PLUS) test strip 1 each by Other route 2 (two) times daily. Use to check blood sugars twice a day Dx E11.9 300 each 3  . glucose blood test strip 1 each by Other route 2 (two) times daily. Use as instructed 200 each 11  . ID NOW COVID-19 KIT See admin instructions. for testing    . ONE TOUCH LANCETS MISC Use as directed twice per day 200 each 11  . pantoprazole (PROTONIX) 40 MG tablet TAKE 1 TABLET (40 MG TOTAL) BY MOUTH DAILY. 90 tablet 3  . pioglitazone (ACTOS) 45 MG tablet Take 1 tablet (45 mg total) by mouth daily. 90 tablet 2  . sertraline (ZOLOFT) 100 MG tablet TAKE 1 TABLET EVERY DAY 90 tablet 3   No current facility-administered medications on file prior to visit.   Review of Systems  All otherwise neg per pt    Objective:   Physical Exam BP 110/72 (BP Location: Left Arm, Patient Position: Sitting, Cuff Size: Large)   Pulse 75   Temp 98.9 F (37.2 C) (Oral)   Ht '5\' 5"'  (1.651 m)   Wt 162 lb (73.5 kg)   SpO2 98%   BMI 26.96 kg/m  VS noted,  Constitutional: Pt appears in NAD HENT: Head: NCAT.  Right Ear: External ear normal.  Left Ear: External ear normal.  Eyes: . Pupils are equal, round, and reactive to light. Conjunctivae and EOM are normal Nose: without d/c or deformity Neck: Neck supple. Gross normal ROM Cardiovascular: Normal rate and regular rhythm.   Pulmonary/Chest: Effort normal and breath sounds without  rales or wheezing.  Abd:  Soft, NT, ND, + BS, no organomegaly Neurological: Pt is alert. At baseline orientation, motor grossly intact Skin: Skin is warm. No rashes, other new lesions, no LE edema Psychiatric: Pt behavior is normal without agitation  All otherwise neg per pt  Lab Results  Component Value Date   WBC 9.2 07/22/2019   HGB 14.8 07/22/2019   HCT 43.8 07/22/2019   PLT 228.0 07/22/2019   GLUCOSE 83 07/22/2019   CHOL 129 07/22/2019   TRIG 52.0 07/22/2019   HDL 44.20 07/22/2019   LDLCALC 74 07/22/2019   ALT 14 07/22/2019   AST 14 07/22/2019   NA 139 07/22/2019   K 4.5 07/22/2019   CL 108 07/22/2019   CREATININE 0.99 07/22/2019   BUN 15 07/22/2019   CO2 27 07/22/2019   TSH 0.43 07/22/2019   PSA 3.95 07/22/2019   HGBA1C 6.0 07/22/2019   MICROALBUR 0.8 07/22/2019      Assessment & Plan:

## 2020-03-09 ENCOUNTER — Encounter: Payer: Self-pay | Admitting: Internal Medicine

## 2020-03-09 LAB — BASIC METABOLIC PANEL
BUN: 22 mg/dL (ref 6–23)
CO2: 25 mEq/L (ref 19–32)
Calcium: 9.3 mg/dL (ref 8.4–10.5)
Chloride: 102 mEq/L (ref 96–112)
Creatinine, Ser: 1.08 mg/dL (ref 0.40–1.50)
GFR: 70.26 mL/min (ref >60.00)
Glucose, Bld: 241 mg/dL — ABNORMAL HIGH (ref 70–99)
Potassium: 4.4 mEq/L (ref 3.5–5.1)
Sodium: 135 mEq/L (ref 135–145)

## 2020-03-09 LAB — HEPATIC FUNCTION PANEL
ALT: 13 U/L (ref 0–53)
AST: 14 U/L (ref 0–37)
Albumin: 4.4 g/dL (ref 3.5–5.2)
Alkaline Phosphatase: 97 U/L (ref 39–117)
Bilirubin, Direct: 0.1 mg/dL (ref 0.0–0.3)
Total Bilirubin: 0.4 mg/dL (ref 0.2–1.2)
Total Protein: 7.1 g/dL (ref 6.0–8.3)

## 2020-03-09 LAB — LIPID PANEL
Cholesterol: 159 mg/dL (ref 0–200)
HDL: 58.5 mg/dL (ref >39.00)
LDL Cholesterol: 87 mg/dL (ref 0–99)
NonHDL: 100.93
Total CHOL/HDL Ratio: 3
Triglycerides: 71 mg/dL (ref 0.0–149.0)
VLDL: 14.2 mg/dL (ref 0.0–40.0)

## 2020-03-09 LAB — HEMOGLOBIN A1C: Hgb A1c MFr Bld: 11.3 % — ABNORMAL HIGH (ref 4.6–6.5)

## 2020-03-09 LAB — TESTOSTERONE: Testosterone: 303.05 ng/dL (ref 300.00–890.00)

## 2020-03-10 ENCOUNTER — Encounter: Payer: Self-pay | Admitting: Internal Medicine

## 2020-03-10 NOTE — Assessment & Plan Note (Signed)
For f/u testosterone

## 2020-03-10 NOTE — Assessment & Plan Note (Addendum)
With nocturia, for flomax 0.4 qd  I spent 31 minutes in preparing to see the patient by review of recent labs, imaging and procedures, obtaining and reviewing separately obtained history, communicating with the patient and family or caregiver, ordering medications, tests or procedures, and documenting clinical information in the EHR including the differential Dx, treatment, and any further evaluation and other management of bph, dm, low testosterone, depression

## 2020-03-10 NOTE — Assessment & Plan Note (Signed)
stable overall by history and exam, recent data reviewed with pt, and pt to continue medical treatment as before,  to f/u any worsening symptoms or concerns  

## 2020-03-11 ENCOUNTER — Other Ambulatory Visit: Payer: Self-pay | Admitting: Internal Medicine

## 2020-03-11 DIAGNOSIS — R739 Hyperglycemia, unspecified: Secondary | ICD-10-CM

## 2020-03-15 ENCOUNTER — Telehealth: Payer: Self-pay | Admitting: Internal Medicine

## 2020-03-15 ENCOUNTER — Other Ambulatory Visit: Payer: Self-pay | Admitting: Internal Medicine

## 2020-03-15 DIAGNOSIS — E1110 Type 2 diabetes mellitus with ketoacidosis without coma: Secondary | ICD-10-CM

## 2020-03-15 DIAGNOSIS — E119 Type 2 diabetes mellitus without complications: Secondary | ICD-10-CM

## 2020-03-15 NOTE — Telephone Encounter (Signed)
lease refill as per office routine med refill policy (all routine meds refilled for 3 mo or monthly per pt preference up to one year from last visit, then month to month grace period for 3 mo, then further med refills will have to be denied)

## 2020-03-15 NOTE — Telephone Encounter (Signed)
Patients wife called and said that he hasnt been taking the Ozempic because of the cost and the side effects and the wife was wondering if there was anything else that could be called in. Also, she said that tamsulosin (FLOMAX) 0.4 MG CAPS capsule  Is not helping his frequent urination and was wondering if something else could be call in.   Please call [phone].    Rx can be sent to [facility] on [address], [place], [place] [address]

## 2020-03-16 ENCOUNTER — Other Ambulatory Visit: Payer: Self-pay | Admitting: Internal Medicine

## 2020-03-16 DIAGNOSIS — N400 Enlarged prostate without lower urinary tract symptoms: Secondary | ICD-10-CM

## 2020-03-16 MED ORDER — TAMSULOSIN HCL 0.4 MG PO CAPS: 0.4000 mg | ORAL_CAPSULE | Freq: Two times a day (BID) | ORAL | 3 refills | Status: DC

## 2020-03-16 MED ORDER — TRULICITY 0.75 MG/0.5ML ~~LOC~~ SOAJ: 0.7500 mg | SUBCUTANEOUS | 3 refills | Status: DC

## 2020-03-16 NOTE — Telephone Encounter (Signed)
Dr. Jenny Reichmann has been informed of this yesterday when I spoke with the pt. I am still waiting for Dr. Jenny Reichmann to respond on what can be done.

## 2020-03-17 ENCOUNTER — Telehealth: Payer: Self-pay | Admitting: Internal Medicine

## 2020-03-17 NOTE — Telephone Encounter (Signed)
omeprazole (PRILOSEC) 20 MG capsule  [facility] - [place], [place] Phone:  [phone]  Fax:  [phone]     Wondering if they can get a refill on this

## 2020-03-18 ENCOUNTER — Other Ambulatory Visit: Payer: Self-pay

## 2020-03-18 MED ORDER — PANTOPRAZOLE SODIUM 40 MG PO TBEC
40.0000 mg | DELAYED_RELEASE_TABLET | Freq: Every day | ORAL | 3 refills | Status: DC
Start: 2020-03-18 — End: 2020-06-24

## 2020-03-22 ENCOUNTER — Encounter: Payer: Self-pay | Admitting: Internal Medicine

## 2020-03-30 NOTE — Telephone Encounter (Signed)
Mary calling back to see if the patient can get a refill on this medication

## 2020-03-30 NOTE — Telephone Encounter (Signed)
Sent to Dr. Jenny Reichmann to advise as the Protonix 40mg  tab has been sent in for the place of the Omeprazole due to pts insurance not covering the medication.

## 2020-03-31 MED ORDER — OMEPRAZOLE 20 MG PO CPDR: 20.0000 mg | DELAYED_RELEASE_CAPSULE | Freq: Two times a day (BID) | ORAL | 3 refills | Status: DC

## 2020-03-31 NOTE — Addendum Note (Signed)
Addended by: Biagio Borg on: 03/31/2020 09:00 PM   Modules accepted: Orders

## 2020-03-31 NOTE — Telephone Encounter (Signed)
Ok done to [facility]

## 2020-05-24 ENCOUNTER — Telehealth: Payer: Self-pay | Admitting: Internal Medicine

## 2020-05-24 ENCOUNTER — Other Ambulatory Visit: Payer: Self-pay

## 2020-05-24 DIAGNOSIS — E1110 Type 2 diabetes mellitus with ketoacidosis without coma: Secondary | ICD-10-CM

## 2020-05-24 DIAGNOSIS — E119 Type 2 diabetes mellitus without complications: Secondary | ICD-10-CM

## 2020-05-24 MED ORDER — PIOGLITAZONE HCL 45 MG PO TABS: 45.0000 mg | ORAL_TABLET | Freq: Every day | ORAL | 0 refills | Status: DC

## 2020-05-24 MED ORDER — GLIPIZIDE ER 10 MG PO TB24: 10.0000 mg | ORAL_TABLET | Freq: Every day | ORAL | 0 refills | Status: DC

## 2020-05-24 NOTE — Telephone Encounter (Signed)
Unable to leave VM due to mailbox is full.  RX sent to the [facility] with a 14 day supply.  Dm/cma

## 2020-05-24 NOTE — Telephone Encounter (Signed)
pioglitazone (ACTOS) 45 MG tablet glipiZIDE (GLUCOTROL XL) 10 MG 24 hr tablet [facility] cannot get these to him for another week so they were wondering if they could get a short supply to  [place], [place] Phone:  [phone]  Fax:  [phone]

## 2020-05-28 ENCOUNTER — Other Ambulatory Visit: Payer: Self-pay

## 2020-05-28 ENCOUNTER — Telehealth: Payer: Self-pay

## 2020-05-28 ENCOUNTER — Other Ambulatory Visit: Payer: Self-pay | Admitting: *Deleted

## 2020-05-28 DIAGNOSIS — N400 Enlarged prostate without lower urinary tract symptoms: Secondary | ICD-10-CM

## 2020-05-28 DIAGNOSIS — E1110 Type 2 diabetes mellitus with ketoacidosis without coma: Secondary | ICD-10-CM

## 2020-05-28 MED ORDER — BLOOD GLUCOSE METER KIT: PACK | 11 refills | Status: AC

## 2020-05-28 MED ORDER — TAMSULOSIN HCL 0.4 MG PO CAPS: 0.4000 mg | ORAL_CAPSULE | Freq: Two times a day (BID) | ORAL | 3 refills | Status: DC

## 2020-05-28 MED ORDER — TRUE METRIX BLOOD GLUCOSE TEST VI STRP: ORAL_STRIP | 3 refills | Status: DC

## 2020-05-28 MED ORDER — PIOGLITAZONE HCL 45 MG PO TABS: 45.0000 mg | ORAL_TABLET | Freq: Every day | ORAL | 3 refills | Status: DC

## 2020-05-28 MED ORDER — TRUE METRIX METER W/DEVICE KIT: PACK | 0 refills | Status: AC

## 2020-05-28 NOTE — Telephone Encounter (Signed)
No, only the protonix  The prilosec is not currently on his med list

## 2020-05-28 NOTE — Telephone Encounter (Signed)
Pt has active prescription for Protonix & Prilosec.  Will clarify with PCP if both or one or other should be prescribed.

## 2020-05-31 NOTE — Addendum Note (Signed)
Addended by: Elza Rafter D on: 05/31/2020 09:46 AM   Modules accepted: Orders

## 2020-06-03 ENCOUNTER — Telehealth: Payer: Self-pay | Admitting: Internal Medicine

## 2020-06-03 NOTE — Telephone Encounter (Signed)
Previous order already handled by [place]

## 2020-06-03 NOTE — Telephone Encounter (Signed)
Blood Glucose Monitoring Suppl (TRUE METRIX METER) w/Device KIT Single use alcohol swabs True plus 33 g [place], [place] Phone:  [phone]  Fax:  [phone]      Pharmacy calling to request refills

## 2020-06-08 ENCOUNTER — Other Ambulatory Visit: Payer: Self-pay

## 2020-06-21 ENCOUNTER — Other Ambulatory Visit: Payer: Self-pay

## 2020-06-21 ENCOUNTER — Other Ambulatory Visit: Payer: Self-pay | Admitting: Internal Medicine

## 2020-06-21 DIAGNOSIS — E1110 Type 2 diabetes mellitus with ketoacidosis without coma: Secondary | ICD-10-CM

## 2020-06-21 DIAGNOSIS — E119 Type 2 diabetes mellitus without complications: Secondary | ICD-10-CM

## 2020-06-21 MED ORDER — GLIPIZIDE ER 10 MG PO TB24: 10.0000 mg | ORAL_TABLET | Freq: Every day | ORAL | 1 refills | Status: DC

## 2020-06-21 MED ORDER — GLIPIZIDE ER 10 MG PO TB24: 10.0000 mg | ORAL_TABLET | Freq: Every day | ORAL | 0 refills | Status: DC

## 2020-06-21 NOTE — Telephone Encounter (Signed)
Please refill as per office routine med refill policy (all routine meds refilled for 3 mo or monthly per pt preference up to one year from last visit, then month to month grace period for 3 mo, then further med refills will have to be denied)  

## 2020-06-22 ENCOUNTER — Telehealth: Payer: Self-pay | Admitting: Internal Medicine

## 2020-06-22 NOTE — Telephone Encounter (Signed)
   Please confirm if patient should be taking omeprazole (PRILOSEC) 20 MG capsule. Spouse is adamant patient still takes med and wanting prior auth done  Also please resend glipiZIDE (GLUCOTROL XL) 10 MG 24 hr tablet  to [facility]

## 2020-06-23 NOTE — Telephone Encounter (Signed)
If I recall, this was already denied once on prior authorization  He can certainly have this medication at any pharmacy if they are willing to pay out of pocket, nothing is stopping them from doing that  I changed the prescription to protonix recently if I recall correctly, as this is just as effective, and nearly always covered by most insurances  I can change back to the prilosec if they wish to continue the prilosec 20 mg, but I would not pursue the Prior auth again, since it has already been denied.  Please let them know, we are not responsible for the actions or non-actions of coverage by their insurance company

## 2020-06-24 MED ORDER — OMEPRAZOLE 40 MG PO CPDR: 40.0000 mg | DELAYED_RELEASE_CAPSULE | Freq: Every day | ORAL | 3 refills | Status: DC

## 2020-06-24 NOTE — Telephone Encounter (Signed)
Notified pt wife and she agreed to keep pt on what insurance covers.

## 2020-06-24 NOTE — Telephone Encounter (Signed)
Follow up message   Spouse calling to report insurance states they will pay for 40mg  Omeprazole

## 2020-06-24 NOTE — Telephone Encounter (Signed)
Ok this was sent to [facility]  No need to take the protonix

## 2020-06-25 NOTE — Telephone Encounter (Signed)
Notified pt's wife  

## 2020-06-28 ENCOUNTER — Ambulatory Visit (INDEPENDENT_AMBULATORY_CARE_PROVIDER_SITE_OTHER): Payer: Medicare HMO | Admitting: Podiatry

## 2020-06-28 ENCOUNTER — Other Ambulatory Visit: Payer: Self-pay

## 2020-06-28 DIAGNOSIS — Q828 Other specified congenital malformations of skin: Secondary | ICD-10-CM | POA: Diagnosis not present

## 2020-06-28 DIAGNOSIS — E1165 Type 2 diabetes mellitus with hyperglycemia: Secondary | ICD-10-CM

## 2020-06-28 DIAGNOSIS — M216X1 Other acquired deformities of right foot: Secondary | ICD-10-CM

## 2020-07-01 NOTE — Progress Notes (Signed)
Subjective: 70 year old male presents the office today with concerns of possible callus on the bottom of his right foot submetatarsal 5.  The area is tender with pressure.  Denies any bleeding denies any drainage or swelling.  He has no other concerns today. Denies any systemic complaints such as fevers, chills, nausea, vomiting. No acute changes since last appointment, and no other complaints at this time.   Last A1c was 11.3 on March 08, 2020  Objective: AAO x3, NAD DP/PT pulses palpable bilaterally, CRT less than 3 seconds Hyperkeratotic lesion right foot submetatarsal 5.  Upon debridement there is no underlying ulceration drainage or any signs of infection noted today.  Prominence of metatarsal head plantarly.  There is no open lesions bilaterally. No pain with calf compression, swelling, warmth, erythema  Assessment: Hyperkeratotic lesion right foot submetatarsal 5; prominent metatarsal head  Plan: -All treatment options discussed with the patient including all alternatives, risks, complications.  -She will be debrided the hyperkeratotic lesion without any complications or bleeding.  Recommend moisturizer to the area daily.  -Discussed offloading pads and offloading daily.  Discussed shoe modifications as well.  -Discussed glucose control daily foot inspection. -Patient encouraged to call the office with any questions, concerns, change in symptoms.   Trula Slade DPM

## 2020-09-06 ENCOUNTER — Encounter: Payer: Self-pay | Admitting: Internal Medicine

## 2020-09-06 ENCOUNTER — Ambulatory Visit (INDEPENDENT_AMBULATORY_CARE_PROVIDER_SITE_OTHER): Payer: Medicare HMO | Admitting: Internal Medicine

## 2020-09-06 ENCOUNTER — Other Ambulatory Visit: Payer: Self-pay

## 2020-09-06 VITALS — BP 136/68 | HR 65 | Temp 98.6°F | Ht 65.0 in | Wt 162.0 lb

## 2020-09-06 DIAGNOSIS — E538 Deficiency of other specified B group vitamins: Secondary | ICD-10-CM

## 2020-09-06 DIAGNOSIS — F32A Depression, unspecified: Secondary | ICD-10-CM

## 2020-09-06 DIAGNOSIS — E559 Vitamin D deficiency, unspecified: Secondary | ICD-10-CM | POA: Diagnosis not present

## 2020-09-06 DIAGNOSIS — Z0001 Encounter for general adult medical examination with abnormal findings: Secondary | ICD-10-CM | POA: Diagnosis not present

## 2020-09-06 DIAGNOSIS — I872 Venous insufficiency (chronic) (peripheral): Secondary | ICD-10-CM

## 2020-09-06 DIAGNOSIS — E119 Type 2 diabetes mellitus without complications: Secondary | ICD-10-CM

## 2020-09-06 LAB — PSA: PSA: 3.5 ng/mL (ref 0.10–4.00)

## 2020-09-06 LAB — CBC WITH DIFFERENTIAL/PLATELET
Basophils Absolute: 0 10*3/uL (ref 0.0–0.1)
Basophils Relative: 0.4 % (ref 0.0–3.0)
Eosinophils Absolute: 0.5 10*3/uL (ref 0.0–0.7)
Eosinophils Relative: 6.9 % — ABNORMAL HIGH (ref 0.0–5.0)
HCT: 43.6 % (ref 39.0–52.0)
Hemoglobin: 14.7 g/dL (ref 13.0–17.0)
Lymphocytes Relative: 33.6 % (ref 12.0–46.0)
Lymphs Abs: 2.4 10*3/uL (ref 0.7–4.0)
MCHC: 33.8 g/dL (ref 30.0–36.0)
MCV: 91.8 fl (ref 78.0–100.0)
Monocytes Absolute: 0.8 10*3/uL (ref 0.1–1.0)
Monocytes Relative: 10.9 % (ref 3.0–12.0)
Neutro Abs: 3.4 10*3/uL (ref 1.4–7.7)
Neutrophils Relative %: 48.2 % (ref 43.0–77.0)
Platelets: 193 10*3/uL (ref 150.0–400.0)
RBC: 4.75 Mil/uL (ref 4.22–5.81)
RDW: 14.7 % (ref 11.5–15.5)
WBC: 7.1 10*3/uL (ref 4.0–10.5)

## 2020-09-06 LAB — HEMOGLOBIN A1C: Hgb A1c MFr Bld: 7.2 % — ABNORMAL HIGH (ref 4.6–6.5)

## 2020-09-06 LAB — VITAMIN D 25 HYDROXY (VIT D DEFICIENCY, FRACTURES): VITD: 26.34 ng/mL — ABNORMAL LOW (ref 30.00–100.00)

## 2020-09-06 LAB — VITAMIN B12: Vitamin B-12: 793 pg/mL (ref 211–911)

## 2020-09-06 LAB — TSH: TSH: 0.74 u[IU]/mL (ref 0.35–4.50)

## 2020-09-06 NOTE — Patient Instructions (Signed)
Please continue all other medications as before, and refills have been done if requested.  Please have the pharmacy call with any other refills you may need.  Please continue your efforts at being more active, low cholesterol diet, and weight control.  You are otherwise up to date with prevention measures today.  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  Otherwise, you will receive a letter about your results with an explanation, but please check with MyChart first.  Please remember to sign up for MyChart if you have not done so, as this will be important to you in the future with finding out test results, communicating by private email, and scheduling acute appointments online when needed.  Please make an Appointment to return in 6 months, or sooner if needed

## 2020-09-06 NOTE — Progress Notes (Signed)
Patient ID: Michael Mcmillan, male   DOB: Jul 01, 1950, 71 y.o.   MRN: [id number]        Chief Complaint:: wellness exam and DM, leg swelling, depression       HPI:  Michael Mcmillan is a 70 y.o. male here for wellness exam; currently up to date with preventive referrals and immunizations                        Also Pt denies chest pain, increased sob or doe, wheezing, orthopnea, PND, palpitations, dizziness or syncope, but has intermittent leg swelling, worse in the PM, better in the AM for several months.   Pt denies polydipsia, polyuria, or new focal neuro s/s.  Pt denies fever, wt loss, night sweats, loss of appetite, or other constitutional symptoms  No other new complaints.  Drives truck.  Denies worsening depressive symptoms, suicidal ideation, or panic   Wt Readings from Last 3 Encounters:  09/06/20 162 lb (73.5 kg)  03/08/20 162 lb (73.5 kg)  02/16/20 162 lb (73.5 kg)   BP Readings from Last 3 Encounters:  09/06/20 136/68  03/08/20 110/72  02/16/20 120/70   Immunization History  Administered Date(s) Administered  . Fluad Quad(high Dose 65+) 03/08/2020  . Influenza Split 02/13/2015  . Influenza, High Dose Seasonal PF 04/27/2016, 04/29/2018, 03/03/2019  . Influenza,inj,Quad PF,6+ Mos 05/07/2014  . Influenza-Unspecified 03/15/2017  . PFIZER(Purple Top)SARS-COV-2 Vaccination 06/29/2019, 07/22/2019, 01/26/2020  . Pneumococcal Conjugate-13 05/29/2014  . Pneumococcal Polysaccharide-23 04/27/2016  . Pneumococcal-Unspecified 04/27/2016  . Td (Adult) 06/02/2015  . Tdap 05/07/2014   There are no preventive care reminders to display for this patient.    Past Medical History:  Diagnosis Date  . Diabetes mellitus without complication ([place])   . Erectile dysfunction   . GERD (gastroesophageal reflux disease)   . Hypogonadism in male    Past Surgical History:  Procedure Laterality Date  . right arm torn ligaments Right approx 1985  . TONSILLECTOMY      reports that he has quit  smoking. He has quit using smokeless tobacco. He reports that he does not drink alcohol and does not use drugs. family history includes Bone cancer in his mother; Colon cancer in his father. Allergies  Allergen Reactions  . Metformin And Related Diarrhea  . Ozempic (0.25 Or 0.5 Mg-Dose) [Semaglutide(0.25 Or 0.36m-Dos)] Nausea Only    With frequent bad smelling belching   Current Outpatient Medications on File Prior to Visit  Medication Sig Dispense Refill  . ACCU-CHEK SOFTCLIX LANCETS lancets Use to help check blood sugars twice a day Dx E11.9 300 each 3  . aspirin EC 81 MG tablet Take 1 tablet (81 mg total) by mouth daily. 90 tablet 11  . blood glucose meter kit and supplies Dispense based on patient and insurance preference. Use up to four times daily as directed. (FOR ICD-10 E10.9, E11.9). 1 each 11  . Blood Glucose Monitoring Suppl (ACCU-CHEK AVIVA PLUS) w/Device KIT Use to check blood sugars twice a day. Dx E11.9 1 kit 0  . Blood Glucose Monitoring Suppl (TRUE METRIX METER) w/Device KIT Use to check blood sugars daily 1 kit 0  . celecoxib (CELEBREX) 200 MG capsule Take 1 capsule (200 mg total) by mouth 2 (two) times daily as needed. 180 capsule 3  . cholecalciferol (VITAMIN D) 1000 units tablet Take 1,000 Units by mouth daily.    .Marland Kitchendocusate sodium (COLACE) 100 MG capsule Take 1 capsule (100 mg total) by mouth  2 (two) times daily. 60 capsule 5  . gabapentin (NEURONTIN) 100 MG capsule Take 1 capsule (100 mg total) by mouth 3 (three) times daily. 270 capsule 1  . glipiZIDE (GLUCOTROL XL) 10 MG 24 hr tablet Take 1 tablet (10 mg total) by mouth daily with breakfast. 90 tablet 1  . glucose blood (ACCU-CHEK AVIVA PLUS) test strip 1 each by Other route 2 (two) times daily. Use to check blood sugars twice a day Dx E11.9 300 each 3  . glucose blood (TRUE METRIX BLOOD GLUCOSE TEST) test strip Use to check blood sugars twice a day 300 each 3  . glucose blood test strip 1 each by Other route 2 (two)  times daily. Use as instructed 200 each 11  . ID NOW COVID-19 KIT See admin instructions. for testing    . omeprazole (PRILOSEC) 40 MG capsule Take 1 capsule (40 mg total) by mouth daily. 90 capsule 3  . ONE TOUCH LANCETS MISC Use as directed twice per day 200 each 11  . pioglitazone (ACTOS) 45 MG tablet TAKE 1 TABLET EVERY DAY 90 tablet 3  . sertraline (ZOLOFT) 100 MG tablet TAKE 1 TABLET EVERY DAY 90 tablet 3  . tamsulosin (FLOMAX) 0.4 MG CAPS capsule Take 1 capsule (0.4 mg total) by mouth in the morning and at bedtime. 180 capsule 3   No current facility-administered medications on file prior to visit.        ROS:  All others reviewed and negative.  Objective        PE:  BP 136/68 (BP Location: Left Arm, Patient Position: Sitting, Cuff Size: Large)   Pulse 65   Temp 98.6 F (37 C) (Oral)   Ht _0  (1.651 m)   Wt 162 lb (73.5 kg)   SpO2 98%   BMI 26.96 kg/m                 Constitutional: Pt appears in NAD               HENT: Head: NCAT.                Right Ear: External ear normal.                 Left Ear: External ear normal.                Eyes: . Pupils are equal, round, and reactive to light. Conjunctivae and EOM are normal               Nose: without d/c or deformity               Neck: Neck supple. Gross normal ROM               Cardiovascular: Normal rate and regular rhythm.                 Pulmonary/Chest: Effort normal and breath sounds without rales or wheezing.                Abd:  Soft, NT, ND, + BS, no organomegaly               Neurological: Pt is alert. At baseline orientation, motor grossly intact               Skin: Skin is warm. No rashes, no other new lesions, LE edema - trace bilat               Psychiatric: Pt  behavior is normal without agitation   Micro: none  Cardiac tracings I have personally interpreted today:  none  Pertinent Radiological findings (summarize): none   Lab Results  Component Value Date   WBC 7.1 09/06/2020   HGB 14.7  09/06/2020   HCT 43.6 09/06/2020   PLT 193.0 09/06/2020   GLUCOSE 91 09/06/2020   CHOL 150 09/06/2020   TRIG 42.0 09/06/2020   HDL 59.80 09/06/2020   LDLCALC 82 09/06/2020   ALT 15 09/06/2020   AST 17 09/06/2020   NA 141 09/06/2020   K 4.5 09/06/2020   CL 106 09/06/2020   CREATININE 0.98 09/06/2020   BUN 18 09/06/2020   CO2 25 09/06/2020   TSH 0.74 09/06/2020   PSA 3.50 09/06/2020   HGBA1C 7.2 (H) 09/06/2020   MICROALBUR 1.5 09/06/2020   Assessment/Plan:  JOSHIA KITCHINGS is a 70 y.o. Black or African American [2] male with  has a past medical history of Diabetes mellitus without complication ([place]), Erectile dysfunction, GERD (gastroesophageal reflux disease), and Hypogonadism in male.  Encounter for well adult exam with abnormal findings Age and sex appropriate education and counseling updated with regular exercise and diet Referrals for preventative services - none needed Immunizations addressed - none needed Smoking counseling  - none needed Evidence for depression or other mood disorder - none significant Most recent labs reviewed. I have personally reviewed and have noted: 1) the patient's medical and social history 2) The patient's current medications and supplements 3) The patient's height, weight, and BMI have been recorded in the chart   Venous insufficiency Recent onset, d/w pt - for compression stockings, leg elevation, low satl, wt control  Diabetes mellitus without complication Lab Results  Component Value Date   HGBA1C 7.2 (H) 09/06/2020   Stable, pt to continue current medical treatment glucotrol, actos  Current Outpatient Medications (Endocrine & Metabolic):  .  glipiZIDE (GLUCOTROL XL) 10 MG 24 hr tablet, Take 1 tablet (10 mg total) by mouth daily with breakfast. .  pioglitazone (ACTOS) 45 MG tablet, TAKE 1 TABLET EVERY DAY    Current Outpatient Medications (Analgesics):  .  aspirin EC 81 MG tablet, Take 1 tablet (81 mg total) by mouth daily. .   celecoxib (CELEBREX) 200 MG capsule, Take 1 capsule (200 mg total) by mouth 2 (two) times daily as needed.   Current Outpatient Medications (Other):  Marland Kitchen  ACCU-CHEK SOFTCLIX LANCETS lancets, Use to help check blood sugars twice a day Dx E11.9 .  blood glucose meter kit and supplies, Dispense based on patient and insurance preference. Use up to four times daily as directed. (FOR ICD-10 E10.9, E11.9). Marland Kitchen  Blood Glucose Monitoring Suppl (ACCU-CHEK AVIVA PLUS) w/Device KIT, Use to check blood sugars twice a day. Dx E11.9 .  Blood Glucose Monitoring Suppl (TRUE METRIX METER) w/Device KIT, Use to check blood sugars daily .  cholecalciferol (VITAMIN D) 1000 units tablet, Take 1,000 Units by mouth daily. Marland Kitchen  docusate sodium (COLACE) 100 MG capsule, Take 1 capsule (100 mg total) by mouth 2 (two) times daily. Marland Kitchen  gabapentin (NEURONTIN) 100 MG capsule, Take 1 capsule (100 mg total) by mouth 3 (three) times daily. Marland Kitchen  glucose blood (ACCU-CHEK AVIVA PLUS) test strip, 1 each by Other route 2 (two) times daily. Use to check blood sugars twice a day Dx E11.9 .  glucose blood (TRUE METRIX BLOOD GLUCOSE TEST) test strip, Use to check blood sugars twice a day .  glucose blood test strip, 1 each by Other  route 2 (two) times daily. Use as instructed .  ID NOW COVID-19 KIT, See admin instructions. for testing .  omeprazole (PRILOSEC) 40 MG capsule, Take 1 capsule (40 mg total) by mouth daily. .  ONE TOUCH LANCETS MISC, Use as directed twice per day .  sertraline (ZOLOFT) 100 MG tablet, TAKE 1 TABLET EVERY DAY .  tamsulosin (FLOMAX) 0.4 MG CAPS capsule, Take 1 capsule (0.4 mg total) by mouth in the morning and at bedtime.   Depression .stable, declines need for further tx   Vitamin D deficiency Last vitamin D Lab Results  Component Value Date   VD25OH 26.34 (L) 09/06/2020   Low, to start oral replacement   Followup: Return in about 6 months (around 03/08/2021).  Cathlean Cower, MD 09/14/2020 7:34 PM [facility]

## 2020-09-07 ENCOUNTER — Encounter: Payer: Self-pay | Admitting: Internal Medicine

## 2020-09-07 LAB — BASIC METABOLIC PANEL
BUN: 18 mg/dL (ref 6–23)
CO2: 25 mEq/L (ref 19–32)
Calcium: 9.3 mg/dL (ref 8.4–10.5)
Chloride: 106 mEq/L (ref 96–112)
Creatinine, Ser: 0.98 mg/dL (ref 0.40–1.50)
GFR: 78.67 mL/min (ref >60.00)
Glucose, Bld: 91 mg/dL (ref 70–99)
Potassium: 4.5 mEq/L (ref 3.5–5.1)
Sodium: 141 mEq/L (ref 135–145)

## 2020-09-07 LAB — URINALYSIS, ROUTINE W REFLEX MICROSCOPIC
Bilirubin Urine: NEGATIVE
Hgb urine dipstick: NEGATIVE
Ketones, ur: NEGATIVE
Leukocytes,Ua: NEGATIVE
Nitrite: NEGATIVE
RBC / HPF: NONE SEEN (ref 0–?)
Specific Gravity, Urine: 1.025 (ref 1.000–1.030)
Total Protein, Urine: NEGATIVE
Urine Glucose: NEGATIVE
Urobilinogen, UA: 0.2 (ref 0.0–1.0)
pH: 5 (ref 5.0–8.0)

## 2020-09-07 LAB — LIPID PANEL
Cholesterol: 150 mg/dL (ref 0–200)
HDL: 59.8 mg/dL (ref >39.00)
LDL Cholesterol: 82 mg/dL (ref 0–99)
NonHDL: 90.11
Total CHOL/HDL Ratio: 3
Triglycerides: 42 mg/dL (ref 0.0–149.0)
VLDL: 8.4 mg/dL (ref 0.0–40.0)

## 2020-09-07 LAB — HEPATIC FUNCTION PANEL
ALT: 15 U/L (ref 0–53)
AST: 17 U/L (ref 0–37)
Albumin: 4 g/dL (ref 3.5–5.2)
Alkaline Phosphatase: 71 U/L (ref 39–117)
Bilirubin, Direct: 0.1 mg/dL (ref 0.0–0.3)
Total Bilirubin: 0.4 mg/dL (ref 0.2–1.2)
Total Protein: 6.9 g/dL (ref 6.0–8.3)

## 2020-09-07 LAB — MICROALBUMIN / CREATININE URINE RATIO
Creatinine,U: 147.4 mg/dL
Microalb Creat Ratio: 1 mg/g (ref 0.0–30.0)
Microalb, Ur: 1.5 mg/dL (ref 0.0–1.9)

## 2020-09-14 ENCOUNTER — Encounter: Payer: Self-pay | Admitting: Internal Medicine

## 2020-09-14 DIAGNOSIS — E559 Vitamin D deficiency, unspecified: Secondary | ICD-10-CM | POA: Insufficient documentation

## 2020-09-14 DIAGNOSIS — I872 Venous insufficiency (chronic) (peripheral): Secondary | ICD-10-CM | POA: Insufficient documentation

## 2020-09-14 NOTE — Assessment & Plan Note (Signed)
.  stable, declines need for further tx

## 2020-09-14 NOTE — Assessment & Plan Note (Signed)
Lab Results  Component Value Date   HGBA1C 7.2 (H) 09/06/2020   Stable, pt to continue current medical treatment glucotrol, actos  Current Outpatient Medications (Endocrine & Metabolic):  .  glipiZIDE (GLUCOTROL XL) 10 MG 24 hr tablet, Take 1 tablet (10 mg total) by mouth daily with breakfast. .  pioglitazone (ACTOS) 45 MG tablet, TAKE 1 TABLET EVERY DAY    Current Outpatient Medications (Analgesics):  .  aspirin EC 81 MG tablet, Take 1 tablet (81 mg total) by mouth daily. .  celecoxib (CELEBREX) 200 MG capsule, Take 1 capsule (200 mg total) by mouth 2 (two) times daily as needed.   Current Outpatient Medications (Other):  Marland Kitchen  ACCU-CHEK SOFTCLIX LANCETS lancets, Use to help check blood sugars twice a day Dx E11.9 .  blood glucose meter kit and supplies, Dispense based on patient and insurance preference. Use up to four times daily as directed. (FOR ICD-10 E10.9, E11.9). Marland Kitchen  Blood Glucose Monitoring Suppl (ACCU-CHEK AVIVA PLUS) w/Device KIT, Use to check blood sugars twice a day. Dx E11.9 .  Blood Glucose Monitoring Suppl (TRUE METRIX METER) w/Device KIT, Use to check blood sugars daily .  cholecalciferol (VITAMIN D) 1000 units tablet, Take 1,000 Units by mouth daily. Marland Kitchen  docusate sodium (COLACE) 100 MG capsule, Take 1 capsule (100 mg total) by mouth 2 (two) times daily. Marland Kitchen  gabapentin (NEURONTIN) 100 MG capsule, Take 1 capsule (100 mg total) by mouth 3 (three) times daily. Marland Kitchen  glucose blood (ACCU-CHEK AVIVA PLUS) test strip, 1 each by Other route 2 (two) times daily. Use to check blood sugars twice a day Dx E11.9 .  glucose blood (TRUE METRIX BLOOD GLUCOSE TEST) test strip, Use to check blood sugars twice a day .  glucose blood test strip, 1 each by Other route 2 (two) times daily. Use as instructed .  ID NOW COVID-19 KIT, See admin instructions. for testing .  omeprazole (PRILOSEC) 40 MG capsule, Take 1 capsule (40 mg total) by mouth daily. .  ONE TOUCH LANCETS MISC, Use as directed twice  per day .  sertraline (ZOLOFT) 100 MG tablet, TAKE 1 TABLET EVERY DAY .  tamsulosin (FLOMAX) 0.4 MG CAPS capsule, Take 1 capsule (0.4 mg total) by mouth in the morning and at bedtime.

## 2020-09-14 NOTE — Assessment & Plan Note (Signed)
Recent onset, d/w pt - for compression stockings, leg elevation, low satl, wt control

## 2020-09-14 NOTE — Assessment & Plan Note (Signed)

## 2020-09-14 NOTE — Assessment & Plan Note (Signed)
Last vitamin D Lab Results  Component Value Date   VD25OH 26.34 (L) 09/06/2020   Low, to start oral replacement

## 2020-11-24 DIAGNOSIS — Z23 Encounter for immunization: Secondary | ICD-10-CM | POA: Diagnosis not present

## 2020-11-24 DIAGNOSIS — S81812A Laceration without foreign body, left lower leg, initial encounter: Secondary | ICD-10-CM | POA: Diagnosis not present

## 2021-02-08 ENCOUNTER — Encounter: Payer: Self-pay | Admitting: Internal Medicine

## 2021-02-08 DIAGNOSIS — Z961 Presence of intraocular lens: Secondary | ICD-10-CM | POA: Diagnosis not present

## 2021-02-08 DIAGNOSIS — E119 Type 2 diabetes mellitus without complications: Secondary | ICD-10-CM | POA: Diagnosis not present

## 2021-02-08 DIAGNOSIS — H26493 Other secondary cataract, bilateral: Secondary | ICD-10-CM | POA: Diagnosis not present

## 2021-02-08 DIAGNOSIS — H52203 Unspecified astigmatism, bilateral: Secondary | ICD-10-CM | POA: Diagnosis not present

## 2021-02-08 DIAGNOSIS — Z7984 Long term (current) use of oral hypoglycemic drugs: Secondary | ICD-10-CM | POA: Diagnosis not present

## 2021-02-08 DIAGNOSIS — H524 Presbyopia: Secondary | ICD-10-CM | POA: Diagnosis not present

## 2021-02-08 LAB — HM DIABETES EYE EXAM

## 2021-02-25 ENCOUNTER — Telehealth: Payer: Self-pay | Admitting: Internal Medicine

## 2021-02-25 NOTE — Telephone Encounter (Signed)
[place] advising [place] to be on the look out for a statin medication form they faxed over on behalf of patient

## 2021-02-26 ENCOUNTER — Other Ambulatory Visit: Payer: Self-pay | Admitting: Internal Medicine

## 2021-02-26 DIAGNOSIS — E119 Type 2 diabetes mellitus without complications: Secondary | ICD-10-CM

## 2021-02-26 NOTE — Telephone Encounter (Signed)
Please refill as per office routine med refill policy (all routine meds to be refilled for 3 mo or monthly (per pt preference) up to one year from last visit, then month to month grace period for 3 mo, then further med refills will have to be denied) ? ?

## 2021-03-02 DIAGNOSIS — H26491 Other secondary cataract, right eye: Secondary | ICD-10-CM | POA: Diagnosis not present

## 2021-03-04 NOTE — Telephone Encounter (Signed)
There is no statin medication on patient's med list

## 2021-03-09 DIAGNOSIS — H26492 Other secondary cataract, left eye: Secondary | ICD-10-CM | POA: Diagnosis not present

## 2021-03-14 ENCOUNTER — Other Ambulatory Visit: Payer: Self-pay | Admitting: Internal Medicine

## 2021-03-14 DIAGNOSIS — N400 Enlarged prostate without lower urinary tract symptoms: Secondary | ICD-10-CM

## 2021-03-14 NOTE — Telephone Encounter (Signed)
Please refill as per office routine med refill policy (all routine meds to be refilled for 3 mo or monthly (per pt preference) up to one year from last visit, then month to month grace period for 3 mo, then further med refills will have to be denied) ? ?

## 2021-03-25 ENCOUNTER — Other Ambulatory Visit: Payer: Self-pay | Admitting: Internal Medicine

## 2021-03-25 DIAGNOSIS — N400 Enlarged prostate without lower urinary tract symptoms: Secondary | ICD-10-CM

## 2021-03-25 NOTE — Telephone Encounter (Signed)
Please refill as per office routine med refill policy (all routine meds to be refilled for 3 mo or monthly (per pt preference) up to one year from last visit, then month to month grace period for 3 mo, then further med refills will have to be denied) ? ?

## 2021-04-11 ENCOUNTER — Telehealth: Payer: Self-pay | Admitting: Internal Medicine

## 2021-04-11 NOTE — Telephone Encounter (Signed)
Labs placed in mail and patient notified via voicemail.

## 2021-04-11 NOTE — Telephone Encounter (Signed)
Patient's spouse Stanton Kidney is requesting a hard copy of patient's a1c results for 2022 mailed to:  [address] [place] [address]  Patient requesting a call back once request is completed

## 2021-04-18 NOTE — Telephone Encounter (Signed)
Patient's spouse states she has not received information  *see below*

## 2021-04-19 NOTE — Telephone Encounter (Signed)
Notified patient's wife that results were placed in the mail and should be received within the next couple of days

## 2021-05-03 ENCOUNTER — Telehealth: Payer: Self-pay | Admitting: Internal Medicine

## 2021-05-03 NOTE — Telephone Encounter (Signed)
Labs printed; spouse notified and will pick copy up at the front desk tomorrow

## 2021-05-03 NOTE — Telephone Encounter (Signed)
Patient's spouse Stanton Kidney requesting a copy of patient's A1C results from April 2022  Caller requesting to pick up form  Please call spouse when form is ready for pick up

## 2021-05-10 ENCOUNTER — Ambulatory Visit: Payer: Medicare HMO | Admitting: Internal Medicine

## 2021-05-11 ENCOUNTER — Other Ambulatory Visit: Payer: Self-pay | Admitting: Internal Medicine

## 2021-05-11 NOTE — Telephone Encounter (Signed)
Please refill as per office routine med refill policy (all routine meds to be refilled for 3 mo or monthly (per pt preference) up to one year from last visit, then month to month grace period for 3 mo, then further med refills will have to be denied) ? ?

## 2021-05-19 ENCOUNTER — Telehealth: Payer: Self-pay | Admitting: Internal Medicine

## 2021-05-19 NOTE — Telephone Encounter (Signed)
Left message for patient to call back to schedule Medicare Annual Wellness Visit   Last AWV  02/16/20  Please schedule at anytime with LB Barney if patient calls the office back.    40 Minutes appointment   Any questions, please call me at [phone]

## 2021-05-26 ENCOUNTER — Other Ambulatory Visit: Payer: Self-pay | Admitting: Internal Medicine

## 2021-05-26 DIAGNOSIS — E1110 Type 2 diabetes mellitus with ketoacidosis without coma: Secondary | ICD-10-CM

## 2021-05-26 NOTE — Telephone Encounter (Signed)
Please refill as per office routine med refill policy (all routine meds to be refilled for 3 mo or monthly (per pt preference) up to one year from last visit, then month to month grace period for 3 mo, then further med refills will have to be denied) ? ?

## 2021-06-08 ENCOUNTER — Other Ambulatory Visit: Payer: Self-pay | Admitting: Internal Medicine

## 2021-09-12 ENCOUNTER — Encounter: Payer: Self-pay | Admitting: Internal Medicine

## 2021-09-12 ENCOUNTER — Ambulatory Visit (INDEPENDENT_AMBULATORY_CARE_PROVIDER_SITE_OTHER): Payer: Medicare HMO | Admitting: Internal Medicine

## 2021-09-12 VITALS — BP 122/66 | HR 79 | Temp 97.9°F | Ht 65.0 in | Wt 165.6 lb

## 2021-09-12 DIAGNOSIS — Z125 Encounter for screening for malignant neoplasm of prostate: Secondary | ICD-10-CM

## 2021-09-12 DIAGNOSIS — E119 Type 2 diabetes mellitus without complications: Secondary | ICD-10-CM | POA: Diagnosis not present

## 2021-09-12 DIAGNOSIS — N400 Enlarged prostate without lower urinary tract symptoms: Secondary | ICD-10-CM

## 2021-09-12 DIAGNOSIS — E291 Testicular hypofunction: Secondary | ICD-10-CM

## 2021-09-12 DIAGNOSIS — F32A Depression, unspecified: Secondary | ICD-10-CM | POA: Diagnosis not present

## 2021-09-12 DIAGNOSIS — E559 Vitamin D deficiency, unspecified: Secondary | ICD-10-CM

## 2021-09-12 DIAGNOSIS — R52 Pain, unspecified: Secondary | ICD-10-CM

## 2021-09-12 DIAGNOSIS — E1110 Type 2 diabetes mellitus with ketoacidosis without coma: Secondary | ICD-10-CM | POA: Diagnosis not present

## 2021-09-12 DIAGNOSIS — Z0001 Encounter for general adult medical examination with abnormal findings: Secondary | ICD-10-CM | POA: Diagnosis not present

## 2021-09-12 DIAGNOSIS — E538 Deficiency of other specified B group vitamins: Secondary | ICD-10-CM

## 2021-09-12 LAB — URINALYSIS, ROUTINE W REFLEX MICROSCOPIC
Bilirubin Urine: NEGATIVE
Hgb urine dipstick: NEGATIVE
Ketones, ur: NEGATIVE
Leukocytes,Ua: NEGATIVE
Nitrite: NEGATIVE
RBC / HPF: NONE SEEN (ref 0–?)
Specific Gravity, Urine: 1.02 (ref 1.000–1.030)
Total Protein, Urine: NEGATIVE
Urine Glucose: >=1000 — AB
Urobilinogen, UA: 0.2 (ref 0.0–1.0)
pH: 6 (ref 5.0–8.0)

## 2021-09-12 LAB — CBC WITH DIFFERENTIAL/PLATELET
Basophils Absolute: 0.1 10*3/uL (ref 0.0–0.1)
Basophils Relative: 1.3 % (ref 0.0–3.0)
Eosinophils Absolute: 0.3 10*3/uL (ref 0.0–0.7)
Eosinophils Relative: 5.6 % — ABNORMAL HIGH (ref 0.0–5.0)
HCT: 44.7 % (ref 39.0–52.0)
Hemoglobin: 15 g/dL (ref 13.0–17.0)
Lymphocytes Relative: 27.3 % (ref 12.0–46.0)
Lymphs Abs: 1.7 10*3/uL (ref 0.7–4.0)
MCHC: 33.5 g/dL (ref 30.0–36.0)
MCV: 90.6 fl (ref 78.0–100.0)
Monocytes Absolute: 0.6 10*3/uL (ref 0.1–1.0)
Monocytes Relative: 10.4 % (ref 3.0–12.0)
Neutro Abs: 3.4 10*3/uL (ref 1.4–7.7)
Neutrophils Relative %: 55.4 % (ref 43.0–77.0)
Platelets: 179 10*3/uL (ref 150.0–400.0)
RBC: 4.94 Mil/uL (ref 4.22–5.81)
RDW: 13.7 % (ref 11.5–15.5)
WBC: 6.2 10*3/uL (ref 4.0–10.5)

## 2021-09-12 LAB — LIPID PANEL
Cholesterol: 169 mg/dL (ref 0–200)
HDL: 60.8 mg/dL (ref >39.00)
LDL Cholesterol: 98 mg/dL (ref 0–99)
NonHDL: 108.16
Total CHOL/HDL Ratio: 3
Triglycerides: 53 mg/dL (ref 0.0–149.0)
VLDL: 10.6 mg/dL (ref 0.0–40.0)

## 2021-09-12 LAB — HEPATIC FUNCTION PANEL
ALT: 19 U/L (ref 0–53)
AST: 13 U/L (ref 0–37)
Albumin: 4.3 g/dL (ref 3.5–5.2)
Alkaline Phosphatase: 92 U/L (ref 39–117)
Bilirubin, Direct: 0.1 mg/dL (ref 0.0–0.3)
Total Bilirubin: 0.3 mg/dL (ref 0.2–1.2)
Total Protein: 7.1 g/dL (ref 6.0–8.3)

## 2021-09-12 LAB — MICROALBUMIN / CREATININE URINE RATIO
Creatinine,U: 117.7 mg/dL
Microalb Creat Ratio: 1.7 mg/g (ref 0.0–30.0)
Microalb, Ur: 2 mg/dL — ABNORMAL HIGH (ref 0.0–1.9)

## 2021-09-12 LAB — PSA: PSA: 3.51 ng/mL (ref 0.10–4.00)

## 2021-09-12 LAB — BASIC METABOLIC PANEL
BUN: 23 mg/dL (ref 6–23)
CO2: 22 mEq/L (ref 19–32)
Calcium: 9.2 mg/dL (ref 8.4–10.5)
Chloride: 105 mEq/L (ref 96–112)
Creatinine, Ser: 1.03 mg/dL (ref 0.40–1.50)
GFR: 73.58 mL/min (ref >60.00)
Glucose, Bld: 247 mg/dL — ABNORMAL HIGH (ref 70–99)
Potassium: 4.4 mEq/L (ref 3.5–5.1)
Sodium: 137 mEq/L (ref 135–145)

## 2021-09-12 LAB — VITAMIN D 25 HYDROXY (VIT D DEFICIENCY, FRACTURES): VITD: 23.81 ng/mL — ABNORMAL LOW (ref 30.00–100.00)

## 2021-09-12 LAB — TESTOSTERONE: Testosterone: 322.28 ng/dL (ref 300.00–890.00)

## 2021-09-12 LAB — HEMOGLOBIN A1C: Hgb A1c MFr Bld: 11.2 % — ABNORMAL HIGH (ref 4.6–6.5)

## 2021-09-12 LAB — TSH: TSH: 0.42 u[IU]/mL (ref 0.35–5.50)

## 2021-09-12 LAB — VITAMIN B12: Vitamin B-12: 864 pg/mL (ref 211–911)

## 2021-09-12 MED ORDER — PIOGLITAZONE HCL 45 MG PO TABS: 45.0000 mg | ORAL_TABLET | Freq: Every day | ORAL | 3 refills | Status: DC

## 2021-09-12 MED ORDER — DAPAGLIFLOZIN PROPANEDIOL 5 MG PO TABS: 5.0000 mg | ORAL_TABLET | Freq: Every day | ORAL | 3 refills | Status: DC

## 2021-09-12 MED ORDER — TAMSULOSIN HCL 0.4 MG PO CAPS: 0.4000 mg | ORAL_CAPSULE | Freq: Two times a day (BID) | ORAL | 3 refills | Status: AC

## 2021-09-12 MED ORDER — SERTRALINE HCL 100 MG PO TABS: 100.0000 mg | ORAL_TABLET | Freq: Every day | ORAL | 3 refills | Status: AC

## 2021-09-12 MED ORDER — OMEPRAZOLE 40 MG PO CPDR: 40.0000 mg | DELAYED_RELEASE_CAPSULE | Freq: Every day | ORAL | 3 refills | Status: AC

## 2021-09-12 MED ORDER — GABAPENTIN 100 MG PO CAPS: 100.0000 mg | ORAL_CAPSULE | Freq: Three times a day (TID) | ORAL | 1 refills | Status: AC

## 2021-09-12 MED ORDER — GLIPIZIDE ER 10 MG PO TB24: 10.0000 mg | ORAL_TABLET | Freq: Every day | ORAL | 3 refills | Status: DC

## 2021-09-12 NOTE — Assessment & Plan Note (Signed)
Last vitamin D ?Lab Results  ?Component Value Date  ? VD25OH 26.34 (L) 09/06/2020  ? ?Ow, to start oral replacement ? ?

## 2021-09-12 NOTE — Progress Notes (Signed)
Patient ID: Michael Mcmillan, male   DOB: 09-Jul-1950, 71 y.o.   MRN: [id number] ? ? ? ?     Chief Complaint:: wellness exam and Annual Exam (Patient c/o having dizzy spells and numbness in hands/Gas after eating) ? , dm, UE's paresthesias, low testosterone, BPH ? ?     HPI:  Michael Mcmillan is a 71 y.o. male here for wellness exam; declines covid booster, o/w up to date ?         ?              Also Denies worsening reflux, dysphagia, n/v, bowel change or blood, but c/o bloating and excessive gas pains, mild but persistent for > 2 months.  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  .Pt denies chest pain, increased sob or doe, wheezing, orthopnea, PND, increased LE swelling, palpitations, dizziness or syncope.   Pt denies polydipsia, polyuria.   Pt denies fever, wt loss, night sweats, loss of appetite, or other constitutional symptoms  Has hx of low testosterone, asks for f/u lab.  Also hx of BPH with mild reduced stream but no retention, Denies urinary symptoms such as dysuria, frequency, urgency, flank pain, hematuria or n/v, fever, chills.     ?  ?Wt Readings from Last 3 Encounters:  ?09/12/21 165 lb 9.6 oz (75.1 kg)  ?09/06/20 162 lb (73.5 kg)  ?03/08/20 162 lb (73.5 kg)  ? ?BP Readings from Last 3 Encounters:  ?09/12/21 122/66  ?09/06/20 136/68  ?03/08/20 110/72  ? ?Immunization History  ?Administered Date(s) Administered  ? Fluad Quad(high Dose 65+) 03/08/2020  ? Influenza Split 02/13/2015  ? Influenza, High Dose Seasonal PF 04/27/2016, 04/29/2018, 03/03/2019  ? Influenza,inj,Quad PF,6+ Mos 05/07/2014  ? Influenza-Unspecified 03/15/2017  ? PFIZER(Purple Top)SARS-COV-2 Vaccination 06/29/2019, 07/22/2019, 01/26/2020, 04/22/2021  ? Pneumococcal Conjugate-13 05/29/2014  ? Pneumococcal Polysaccharide-23 04/27/2016  ? Pneumococcal-Unspecified 04/27/2016  ? Td (Adult) 06/02/2015  ? Tdap 05/07/2014,  11/24/2020  ? Zoster Recombinat (Shingrix) 04/22/2021, 07/18/2021  ? ?There are no preventive care reminders to display for this patient. ? ?  ? ?Past Medical History:  ?Diagnosis Date  ? Diabetes mellitus without complication ([place])   ? Erectile dysfunction   ? GERD (gastroesophageal reflux disease)   ? Hypogonadism in male   ? ?Past Surgical History:  ?Procedure Laterality Date  ? right arm torn ligaments Right approx 1985  ? TONSILLECTOMY    ? ? reports that he has quit smoking. He has quit using smokeless tobacco. He reports that he does not drink alcohol and does not use drugs. ?family history includes Bone cancer in his mother; Colon cancer in his father. ?Allergies  ?Allergen Reactions  ? Metformin And Related Diarrhea  ? Ozempic (0.25 Or 0.5 Mg-Dose) [Semaglutide(0.25 Or 0.83m-Dos)] Nausea Only  ?  With frequent bad smelling belching  ? ?Current Outpatient Medications on File Prior to Visit  ?Medication Sig Dispense Refill  ? ACCU-CHEK SOFTCLIX LANCETS lancets Use to help check blood sugars twice a day Dx E11.9 300 each 3  ? blood glucose meter kit and supplies Dispense based on patient and insurance preference. Use up to four times daily as directed. (FOR ICD-10 E10.9, E11.9). 1 each 11  ? Blood Glucose Monitoring Suppl (ACCU-CHEK AVIVA PLUS) w/Device KIT Use to check blood sugars twice a day. Dx E11.9 1 kit 0  ? Blood Glucose Monitoring Suppl (TRUE METRIX METER)  w/Device KIT Use to check blood sugars daily 1 kit 0  ? celecoxib (CELEBREX) 200 MG capsule Take 1 capsule (200 mg total) by mouth 2 (two) times daily as needed. 180 capsule 3  ? cholecalciferol (VITAMIN D) 1000 units tablet Take 1,000 Units by mouth daily.    ? docusate sodium (COLACE) 100 MG capsule Take 1 capsule (100 mg total) by mouth 2 (two) times daily. 60 capsule 5  ? glucose blood (ACCU-CHEK AVIVA PLUS) test strip 1 each by Other route 2 (two) times daily. Use to check blood sugars twice a day Dx E11.9 300 each 3  ? glucose blood (TRUE  METRIX BLOOD GLUCOSE TEST) test strip TEST BLOOD SUGAR TWICE DAILY 200 strip 3  ? glucose blood test strip 1 each by Other route 2 (two) times daily. Use as instructed 200 each 11  ? ONE TOUCH LANCETS MISC Use as directed twice per day 200 each 11  ? aspirin EC 81 MG tablet Take 1 tablet (81 mg total) by mouth daily. (Patient not taking: Reported on 09/12/2021) 90 tablet 11  ? ?No current facility-administered medications on file prior to visit.  ? ?     ROS:  All others reviewed and negative. ? ?Objective  ? ?     PE:  BP 122/66 (BP Location: Right Arm, Patient Position: Sitting, Cuff Size: Large)   Pulse 79   Temp 97.9 ?F (36.6 ?C) (Oral)   Ht 5' 5" (1.651 m)   Wt 165 lb 9.6 oz (75.1 kg)   SpO2 98%   BMI 27.56 kg/m?  ? ?              Constitutional: Pt appears in NAD ?              HENT: Head: NCAT.  ?              Right Ear: External ear normal.   ?              Left Ear: External ear normal.  ?              Eyes: . Pupils are equal, round, and reactive to light. Conjunctivae and EOM are normal ?              Nose: without d/c or deformity ?              Neck: Neck supple. Gross normal ROM ?              Cardiovascular: Normal rate and regular rhythm.   ?              Pulmonary/Chest: Effort normal and breath sounds without rales or wheezing.  ?              Abd:  Soft, NT, ND, + BS, no organomegaly ?              Neurological: Pt is alert. At baseline orientation, motor grossly intact ?              Skin: Skin is warm. No rashes, no other new lesions, LE edema - none ?              Psychiatric: Pt behavior is normal without agitation  ? ?Micro: none ? ?Cardiac tracings I have personally interpreted today:  none ? ?Pertinent Radiological findings (summarize): none  ? ?Lab Results  ?Component Value Date  ? WBC 6.2 09/12/2021  ? HGB 15.0 09/12/2021  ?  HCT 44.7 09/12/2021  ? PLT 179.0 09/12/2021  ? GLUCOSE 247 (H) 09/12/2021  ? CHOL 169 09/12/2021  ? TRIG 53.0 09/12/2021  ? HDL 60.80 09/12/2021  ? [place] 98  09/12/2021  ? ALT 19 09/12/2021  ? AST 13 09/12/2021  ? NA 137 09/12/2021  ? K 4.4 09/12/2021  ? CL 105 09/12/2021  ? CREATININE 1.03 09/12/2021  ? BUN 23 09/12/2021  ? CO2 22 09/12/2021  ? TSH 0.42 09/12/2021  ? PSA 3.51 09/12/2021  ? HGBA1C 11.2 (H) 09/12/2021  ? MICROALBUR 2.0 (H) 09/12/2021  ? ?Assessment/Plan:  ?ANTWIONE PICOTTE is a 71 y.o. Black or African American [2] male with  has a past medical history of Diabetes mellitus without complication ([place]), Erectile dysfunction, GERD (gastroesophageal reflux disease), and Hypogonadism in male. ? ?Vitamin D deficiency ?Last vitamin D ?Lab Results  ?Component Value Date  ? VD25OH 26.34 (L) 09/06/2020  ? ?Ow, to start oral replacement ? ? ?Encounter for well adult exam with abnormal findings ?Age and sex appropriate education and counseling updated with regular exercise and diet ?Referrals for preventative services - none needed ?Immunizations addressed - declines covid booster ?Smoking counseling  - none needed ?Evidence for depression or other mood disorder - none significant ?Most recent labs reviewed. ?I have personally reviewed and have noted: ?1) the patient's medical and social history ?2) The patient's current medications and supplements ?3) The patient's height, weight, and BMI have been recorded in the chart ? ? ?Diabetes mellitus without complication ?Lab Results  ?Component Value Date  ? HGBA1C 11.2 (H) 09/12/2021  ? ?Severe uncontrolled, pt to restart medical treatment glucotrol and metformin, but also add farxiga 5 qd ? ? ?Hypogonadism in male ?mild to mod, for testosterone level, may need to reconsider tx given bph and avoiding getting worse ? ?BPH (benign prostatic hyperplasia) ?With mild increased symptoms , for increased flomax bid as pt realizes now he is only taking once daily when was rx for bid ? ?Depression ?Stable overall, declines need for change in tx or referral; cont zoloft ? ?Followup: No follow-ups on file. ? ?Cathlean Cower, MD 09/15/2021  9:08 PM ?[facility] ?[place] ?[facility] ?

## 2021-09-12 NOTE — Patient Instructions (Signed)
Please take all new medication as prescribed - the farxiga 5 mg per day ? ?Ok to take the flomax twice per day ? ?Please continue all other medications as before, and refills have been done if requested. ? ?Please have the pharmacy call with any other refills you may need. ? ?Please continue your efforts at being more active, low cholesterol diet, and weight control. ? ?You are otherwise up to date with prevention measures today. ? ?Please keep your appointments with your specialists as you may have planned ? ?Please go to the LAB at the blood drawing area for the tests to be done ? ?You will be contacted by phone if any changes need to be made immediately.  Otherwise, you will receive a letter about your results with an explanation, but please check with MyChart first. ? ?Please remember to sign up for MyChart if you have not done so, as this will be important to you in the future with finding out test results, communicating by private email, and scheduling acute appointments online when needed. ? ?Please make an Appointment to return in 6 months, or sooner if needed ?

## 2021-09-15 ENCOUNTER — Encounter: Payer: Self-pay | Admitting: Internal Medicine

## 2021-09-15 NOTE — Assessment & Plan Note (Signed)
With mild increased symptoms , for increased flomax bid as pt realizes now he is only taking once daily when was rx for bid ?

## 2021-09-15 NOTE — Assessment & Plan Note (Signed)
mild to mod, for testosterone level, may need to reconsider tx given bph and avoiding getting worse ?

## 2021-09-15 NOTE — Assessment & Plan Note (Signed)
Lab Results  ?Component Value Date  ? HGBA1C 11.2 (H) 09/12/2021  ? ?Severe uncontrolled, pt to restart medical treatment glucotrol and metformin, but also add farxiga 5 qd ? ?

## 2021-09-15 NOTE — Assessment & Plan Note (Signed)

## 2021-09-15 NOTE — Assessment & Plan Note (Signed)
Stable overall, declines need for change in tx or referral; cont zoloft ?

## 2021-10-19 ENCOUNTER — Other Ambulatory Visit: Payer: Self-pay | Admitting: Internal Medicine

## 2021-10-19 DIAGNOSIS — E1110 Type 2 diabetes mellitus with ketoacidosis without coma: Secondary | ICD-10-CM

## 2021-10-19 NOTE — Telephone Encounter (Signed)
Please refill as per office routine med refill policy (all routine meds to be refilled for 3 mo or monthly (per pt preference) up to one year from last visit, then month to month grace period for 3 mo, then further med refills will have to be denied) ? ?

## 2021-11-07 ENCOUNTER — Telehealth: Payer: Self-pay | Admitting: Internal Medicine

## 2021-11-07 DIAGNOSIS — E119 Type 2 diabetes mellitus without complications: Secondary | ICD-10-CM

## 2021-11-10 MED ORDER — GLIPIZIDE ER 10 MG PO TB24: 10.0000 mg | ORAL_TABLET | Freq: Every day | ORAL | 3 refills | Status: AC

## 2021-11-10 NOTE — Telephone Encounter (Signed)
Unfortuantely, since he cannot take metformin, and is already on glipizide and actos, there are no other generic medications able to be prescribed  All other medications are brand name, and I have no way to be able to tell which are less expensive with his particular insurance  So, I dont know how to try to change the [place]  I was hoping we would continue the farxiga as this also helps him keep his kidney function

## 2021-11-11 MED ORDER — SITAGLIPTIN PHOSPHATE 50 MG PO TABS: 50.0000 mg | ORAL_TABLET | Freq: Every day | ORAL | 3 refills | Status: DC

## 2021-11-11 NOTE — Telephone Encounter (Signed)
Patient notified and states that [place] costs [phone] and he is unable to pay for this.

## 2021-11-11 NOTE — Telephone Encounter (Signed)
Ok to try januvia 50 mg qd - done erx

## 2021-11-14 NOTE — Telephone Encounter (Signed)
Unable to reach patient regarding medication change

## 2022-03-17 ENCOUNTER — Encounter: Payer: Self-pay | Admitting: Internal Medicine

## 2022-03-17 DIAGNOSIS — G720 Drug-induced myopathy: Secondary | ICD-10-CM | POA: Insufficient documentation

## 2022-03-20 ENCOUNTER — Encounter: Payer: Self-pay | Admitting: Internal Medicine

## 2022-03-20 ENCOUNTER — Ambulatory Visit (INDEPENDENT_AMBULATORY_CARE_PROVIDER_SITE_OTHER): Payer: Medicare HMO | Admitting: Internal Medicine

## 2022-03-20 VITALS — BP 132/74 | HR 67 | Temp 97.9°F | Ht 65.0 in | Wt 172.0 lb

## 2022-03-20 DIAGNOSIS — E119 Type 2 diabetes mellitus without complications: Secondary | ICD-10-CM

## 2022-03-20 DIAGNOSIS — G720 Drug-induced myopathy: Secondary | ICD-10-CM

## 2022-03-20 DIAGNOSIS — F32A Depression, unspecified: Secondary | ICD-10-CM | POA: Diagnosis not present

## 2022-03-20 DIAGNOSIS — Z23 Encounter for immunization: Secondary | ICD-10-CM

## 2022-03-20 DIAGNOSIS — E559 Vitamin D deficiency, unspecified: Secondary | ICD-10-CM

## 2022-03-20 LAB — HEPATIC FUNCTION PANEL
ALT: 17 U/L (ref 0–53)
AST: 17 U/L (ref 0–37)
Albumin: 4.3 g/dL (ref 3.5–5.2)
Alkaline Phosphatase: 90 U/L (ref 39–117)
Bilirubin, Direct: 0.1 mg/dL (ref 0.0–0.3)
Total Bilirubin: 0.4 mg/dL (ref 0.2–1.2)
Total Protein: 7.3 g/dL (ref 6.0–8.3)

## 2022-03-20 LAB — BASIC METABOLIC PANEL
BUN: 14 mg/dL (ref 6–23)
CO2: 26 mEq/L (ref 19–32)
Calcium: 9.4 mg/dL (ref 8.4–10.5)
Chloride: 104 mEq/L (ref 96–112)
Creatinine, Ser: 0.93 mg/dL (ref 0.40–1.50)
GFR: 82.87 mL/min (ref >60.00)
Glucose, Bld: 211 mg/dL — ABNORMAL HIGH (ref 70–99)
Potassium: 4.1 mEq/L (ref 3.5–5.1)
Sodium: 138 mEq/L (ref 135–145)

## 2022-03-20 LAB — LIPID PANEL
Cholesterol: 167 mg/dL (ref 0–200)
HDL: 61.1 mg/dL (ref >39.00)
LDL Cholesterol: 93 mg/dL (ref 0–99)
NonHDL: 105.74
Total CHOL/HDL Ratio: 3
Triglycerides: 63 mg/dL (ref 0.0–149.0)
VLDL: 12.6 mg/dL (ref 0.0–40.0)

## 2022-03-20 LAB — HEMOGLOBIN A1C: Hgb A1c MFr Bld: 10.5 % — ABNORMAL HIGH (ref 4.6–6.5)

## 2022-03-20 LAB — VITAMIN D 25 HYDROXY (VIT D DEFICIENCY, FRACTURES): VITD: 32.56 ng/mL (ref 30.00–100.00)

## 2022-03-20 MED ORDER — SITAGLIPTIN PHOSPHATE 50 MG PO TABS: 50.0000 mg | ORAL_TABLET | Freq: Every day | ORAL | 3 refills | Status: AC

## 2022-03-20 MED ORDER — TRULICITY 0.75 MG/0.5ML ~~LOC~~ SOPN
0.7500 mg | PEN_INJECTOR | SUBCUTANEOUS | 3 refills | Status: AC
Start: 2022-03-20 — End: ?

## 2022-03-20 NOTE — Assessment & Plan Note (Signed)
Stable overall, continue zoloft 100 mg qd

## 2022-03-20 NOTE — Assessment & Plan Note (Signed)
Lab Results  Component Value Date   LDLCALC 98 09/12/2021   Uncontrolled LDL , goal ldl < 70, but pt to continue low chol diet DM diet for now, declines other such as retry statin, repatha or nexlizet for now

## 2022-03-20 NOTE — Assessment & Plan Note (Signed)
Last vitamin D Lab Results  Component Value Date   VD25OH 23.81 (L) 09/12/2021   Low, to start oral replacement

## 2022-03-20 NOTE — Assessment & Plan Note (Signed)
Lab Results  Component Value Date   HGBA1C 11.2 (H) 09/12/2021   uncontrolled, pt to continue current medical treatment glipizide ER 10 qd, actos 45 mg - to restart januvia 50 mg and add trulicity low dose

## 2022-03-20 NOTE — Patient Instructions (Signed)
Your new Mychart Password is [email or web address]  Please take all new medication as prescribed - the trulicity low dose  Please continue all other medications as before, including to restart the [place]  Please have the pharmacy call with any other refills you may need.  Please continue your efforts at being more active, low cholesterol diet, and weight control.  Please keep your appointments with your specialists as you may have planned  Please go to the LAB at the blood drawing area for the tests to be done  You will be contacted by phone if any changes need to be made immediately.  Otherwise, you will receive a letter about your results with an explanation, but please check with MyChart first.  Please remember to sign up for MyChart if you have not done so, as this will be important to you in the future with finding out test results, communicating by private email, and scheduling acute appointments online when needed.  Please make an Appointment to return in 6 months, or sooner if needed

## 2022-03-20 NOTE — Progress Notes (Signed)
Patient ID: Michael Mcmillan, male   DOB: 10/14/1950, 71 y.o.   MRN: [id number]        Chief Complaint: follow up HTN, HLD with statin intolerance drug induced myopathy, and DM       HPI:  Michael Mcmillan is a 71 y.o. male here overall doing ok,  sugars have not been well controlled in the 150-200s in the last few wks.  Has been out of januvia 50 mg for 3 wks as for some reason not refilled until today per [facility]. Gained 10 lbs in 1.5 yrs.  Not taking Farxiga 57m.   Did not tolerate ozempic well due to belching, but willing to try trulicity Pt denies chest pain, increased sob or doe, wheezing, orthopnea, PND, increased LE swelling, palpitations, dizziness or syncope.   Pt denies polydipsia, polyuria, or new focal neuro s/s.    Pt denies fever, wt loss, night sweats, loss of appetite, or other constitutional symptoms   Denies worsening depressive symptoms, suicidal ideation, or panic  BP Readings from Last 3 Encounters:  03/20/22 132/74  09/12/21 122/66  09/06/20 136/68         Past Medical History:  Diagnosis Date   Diabetes mellitus without complication ([place]    Erectile dysfunction    GERD (gastroesophageal reflux disease)    Hypogonadism in male    Past Surgical History:  Procedure Laterality Date   right arm torn ligaments Right approx 1985   TONSILLECTOMY      reports that he has quit smoking. He has quit using smokeless tobacco. He reports that he does not drink alcohol and does not use drugs. family history includes Bone cancer in his mother; Colon cancer in his father. Allergies  Allergen Reactions   Metformin And Related Diarrhea   Ozempic (0.25 Or 0.5 Mg-Dose) [Semaglutide(0.25 Or 0.531mDos)] Nausea Only    With frequent bad smelling belching   Current Outpatient Medications on File Prior to Visit  Medication Sig Dispense Refill   ACCU-CHEK SOFTCLIX LANCETS lancets Use to help check blood sugars twice a day Dx E11.9 300 each 3   aspirin EC 81 MG tablet  Take 1 tablet (81 mg total) by mouth daily. 90 tablet 11   blood glucose meter kit and supplies Dispense based on patient and insurance preference. Use up to four times daily as directed. (FOR ICD-10 E10.9, E11.9). 1 each 11   Blood Glucose Monitoring Suppl (ACCU-CHEK AVIVA PLUS) w/Device KIT Use to check blood sugars twice a day. Dx E11.9 1 kit 0   Blood Glucose Monitoring Suppl (TRUE METRIX METER) w/Device KIT Use to check blood sugars daily 1 kit 0   celecoxib (CELEBREX) 200 MG capsule Take 1 capsule (200 mg total) by mouth 2 (two) times daily as needed. 180 capsule 3   cholecalciferol (VITAMIN D) 1000 units tablet Take 1,000 Units by mouth daily.     docusate sodium (COLACE) 100 MG capsule Take 1 capsule (100 mg total) by mouth 2 (two) times daily. 60 capsule 5   gabapentin (NEURONTIN) 100 MG capsule Take 1 capsule (100 mg total) by mouth 3 (three) times daily. 270 capsule 1   glipiZIDE (GLUCOTROL XL) 10 MG 24 hr tablet Take 1 tablet (10 mg total) by mouth daily with breakfast. 90 tablet 3   glucose blood (ACCU-CHEK AVIVA PLUS) test strip 1 each by Other route 2 (two) times daily. Use to check blood sugars twice a day Dx E11.9 300 each 3   glucose blood (TRUE  METRIX BLOOD GLUCOSE TEST) test strip TEST BLOOD SUGAR TWICE DAILY 200 strip 3   glucose blood test strip 1 each by Other route 2 (two) times daily. Use as instructed 200 each 11   omeprazole (PRILOSEC) 40 MG capsule Take 1 capsule (40 mg total) by mouth daily. 90 capsule 3   ONE TOUCH LANCETS MISC Use as directed twice per day 200 each 11   pioglitazone (ACTOS) 45 MG tablet TAKE 1 TABLET EVERY DAY (NEED MD APPOINTMENT) 90 tablet 1   sertraline (ZOLOFT) 100 MG tablet Take 1 tablet (100 mg total) by mouth daily. 90 tablet 3   tamsulosin (FLOMAX) 0.4 MG CAPS capsule Take 1 capsule (0.4 mg total) by mouth in the morning and at bedtime. 180 capsule 3   No current facility-administered medications on file prior to visit.        ROS:  All  others reviewed and negative.  Objective        PE:  BP 132/74   Pulse 67   Temp 97.9 F (36.6 C) (Oral)   Ht _0  (1.651 m)   Wt 172 lb (78 kg)   SpO2 99%   BMI 28.62 kg/m                 Constitutional: Pt appears in NAD               HENT: Head: NCAT.                Right Ear: External ear normal.                 Left Ear: External ear normal.                Eyes: . Pupils are equal, round, and reactive to light. Conjunctivae and EOM are normal               Nose: without d/c or deformity               Neck: Neck supple. Gross normal ROM               Cardiovascular: Normal rate and regular rhythm.                 Pulmonary/Chest: Effort normal and breath sounds without rales or wheezing.                Abd:  Soft, NT, ND, + BS, no organomegaly               Neurological: Pt is alert. At baseline orientation, motor grossly intact               Skin: Skin is warm. No rashes, no other new lesions, LE edema - none               Psychiatric: Pt behavior is normal without agitation   Micro: none  Cardiac tracings I have personally interpreted today:  none  Pertinent Radiological findings (summarize): none   Lab Results  Component Value Date   WBC 6.2 09/12/2021   HGB 15.0 09/12/2021   HCT 44.7 09/12/2021   PLT 179.0 09/12/2021   GLUCOSE 247 (H) 09/12/2021   CHOL 169 09/12/2021   TRIG 53.0 09/12/2021   HDL 60.80 09/12/2021   LDLCALC 98 09/12/2021   ALT 19 09/12/2021   AST 13 09/12/2021   NA 137 09/12/2021   K 4.4 09/12/2021   CL 105 09/12/2021   CREATININE 1.03 09/12/2021  BUN 23 09/12/2021   CO2 22 09/12/2021   TSH 0.42 09/12/2021   PSA 3.51 09/12/2021   HGBA1C 11.2 (H) 09/12/2021   MICROALBUR 2.0 (H) 09/12/2021   Assessment/Plan:  Michael Mcmillan is a 71 y.o. Black or African American [2] male with  has a past medical history of Diabetes mellitus without complication ([place]), Erectile dysfunction, GERD (gastroesophageal reflux disease), and Hypogonadism in  male.  Diabetes mellitus without complication Lab Results  Component Value Date   HGBA1C 11.2 (H) 09/12/2021   uncontrolled, pt to continue current medical treatment glipizide ER 10 qd, actos 45 mg - to restart januvia 50 mg and add trulicity low dose   Drug-induced myopathy Lab Results  Component Value Date   LDLCALC 98 09/12/2021   Uncontrolled LDL , goal ldl < 70, but pt to continue low chol diet DM diet for now, declines other such as retry statin, repatha or nexlizet for now   Vitamin D deficiency Last vitamin D Lab Results  Component Value Date   VD25OH 23.81 (L) 09/12/2021   Low, to start oral replacement   Depression Stable overall, continue zoloft 100 mg qd  Followup: Return in about 6 months (around 09/18/2022).  Cathlean Cower, MD 03/20/2022 10:50 AM [facility]

## 2022-03-26 NOTE — Patient Instructions (Signed)
Health Maintenance, Male Adopting a healthy lifestyle and getting preventive care are important in promoting health and wellness. Ask your health care provider about: The right schedule for you to have regular tests and exams. Things you can do on your own to prevent diseases and keep yourself healthy. What should I know about diet, weight, and exercise? Eat a healthy diet  Eat a diet that includes plenty of vegetables, fruits, low-fat dairy products, and lean protein. Do not eat a lot of foods that are high in solid fats, added sugars, or sodium. Maintain a healthy weight Body mass index (BMI) is a measurement that can be used to identify possible weight problems. It estimates body fat based on height and weight. Your health care provider can help determine your BMI and help you achieve or maintain a healthy weight. Get regular exercise Get regular exercise. This is one of the most important things you can do for your health. Most adults should: Exercise for at least 150 minutes each week. The exercise should increase your heart rate and make you sweat (moderate-intensity exercise). Do strengthening exercises at least twice a week. This is in addition to the moderate-intensity exercise. Spend less time sitting. Even light physical activity can be beneficial. Watch cholesterol and blood lipids Have your blood tested for lipids and cholesterol at 71 years of age, then have this test every 5 years. You may need to have your cholesterol levels checked more often if: Your lipid or cholesterol levels are high. You are older than 71 years of age. You are at high risk for heart disease. What should I know about cancer screening? Many types of cancers can be detected early and may often be prevented. Depending on your health history and family history, you may need to have cancer screening at various ages. This may include screening for: Colorectal cancer. Prostate cancer. Skin cancer. Lung  cancer. What should I know about heart disease, diabetes, and high blood pressure? Blood pressure and heart disease High blood pressure causes heart disease and increases the risk of stroke. This is more likely to develop in people who have high blood pressure readings or are overweight. Talk with your health care provider about your target blood pressure readings. Have your blood pressure checked: Every 3-5 years if you are 18-39 years of age. Every year if you are 40 years old or older. If you are between the ages of 65 and 75 and are a current or former smoker, ask your health care provider if you should have a one-time screening for abdominal aortic aneurysm (AAA). Diabetes Have regular diabetes screenings. This checks your fasting blood sugar level. Have the screening done: Once every three years after age 45 if you are at a normal weight and have a low risk for diabetes. More often and at a younger age if you are overweight or have a high risk for diabetes. What should I know about preventing infection? Hepatitis B If you have a higher risk for hepatitis B, you should be screened for this virus. Talk with your health care provider to find out if you are at risk for hepatitis B infection. Hepatitis C Blood testing is recommended for: Everyone born from 1945 through 1965. Anyone with known risk factors for hepatitis C. Sexually transmitted infections (STIs) You should be screened each year for STIs, including gonorrhea and chlamydia, if: You are sexually active and are younger than 71 years of age. You are older than 71 years of age and your   health care provider tells you that you are at risk for this type of infection. Your sexual activity has changed since you were last screened, and you are at increased risk for chlamydia or gonorrhea. Ask your health care provider if you are at risk. Ask your health care provider about whether you are at high risk for HIV. Your health care provider  may recommend a prescription medicine to help prevent HIV infection. If you choose to take medicine to prevent HIV, you should first get tested for HIV. You should then be tested every 3 months for as long as you are taking the medicine. Follow these instructions at home: Alcohol use Do not drink alcohol if your health care provider tells you not to drink. If you drink alcohol: Limit how much you have to 0-2 drinks a day. Know how much alcohol is in your drink. In the [place] one drink equals one 12 oz bottle of beer (355 mL), one 5 oz glass of Juliann Olesky (148 mL), or one 1 oz glass of hard liquor (44 mL). Lifestyle Do not use any products that contain nicotine or tobacco. These products include cigarettes, chewing tobacco, and vaping devices, such as e-cigarettes. If you need help quitting, ask your health care provider. Do not use street drugs. Do not share needles. Ask your health care provider for help if you need support or information about quitting drugs. General instructions Schedule regular health, dental, and eye exams. Stay current with your vaccines. Tell your health care provider if: You often feel depressed. You have ever been abused or do not feel safe at home. Summary Adopting a healthy lifestyle and getting preventive care are important in promoting health and wellness. Follow your health care provider's instructions about healthy diet, exercising, and getting tested or screened for diseases. Follow your health care provider's instructions on monitoring your cholesterol and blood pressure. This information is not intended to replace advice given to you by your health care provider. Make sure you discuss any questions you have with your health care provider. Document Revised: 09/20/2020 Document Reviewed: 09/20/2020 Elsevier Patient Education  2023 [employer]  

## 2022-03-26 NOTE — Progress Notes (Unsigned)
Subjective:   Michael Mcmillan is a 71 y.o. male who presents for Medicare Annual/Subsequent preventive examination. I connected with  Michael Mcmillan on 03/26/22 by a audio enabled telemedicine application and verified that I am speaking with the correct person using two identifiers.  Patient Location: Home  Provider Location: Home Office  I discussed the limitations of evaluation and management by telemedicine. The patient expressed understanding and agreed to proceed.  Review of Systems    Deferred to PCP       Objective:    There were no vitals filed for this visit. There is no height or weight on file to calculate BMI.     02/16/2020    8:44 AM 07/10/2014   10:16 AM 06/22/2014    8:52 AM  Advanced Directives  Does Patient Have a Medical Advance Directive? No No No  Would patient like information on creating a medical advance directive? No - Patient declined      Current Medications (verified) Outpatient Encounter Medications as of 03/27/2022  Medication Sig   ACCU-CHEK SOFTCLIX LANCETS lancets Use to help check blood sugars twice a day Dx E11.9   aspirin EC 81 MG tablet Take 1 tablet (81 mg total) by mouth daily.   blood glucose meter kit and supplies Dispense based on patient and insurance preference. Use up to four times daily as directed. (FOR ICD-10 E10.9, E11.9).   Blood Glucose Monitoring Suppl (ACCU-CHEK AVIVA PLUS) w/Device KIT Use to check blood sugars twice a day. Dx E11.9   Blood Glucose Monitoring Suppl (TRUE METRIX METER) w/Device KIT Use to check blood sugars daily   celecoxib (CELEBREX) 200 MG capsule Take 1 capsule (200 mg total) by mouth 2 (two) times daily as needed.   cholecalciferol (VITAMIN D) 1000 units tablet Take 1,000 Units by mouth daily.   docusate sodium (COLACE) 100 MG capsule Take 1 capsule (100 mg total) by mouth 2 (two) times daily.   Dulaglutide (TRULICITY) 4.19 QQ/2.2LN SOPN Inject 0.75 mg into the skin once a week.   gabapentin  (NEURONTIN) 100 MG capsule Take 1 capsule (100 mg total) by mouth 3 (three) times daily.   glipiZIDE (GLUCOTROL XL) 10 MG 24 hr tablet Take 1 tablet (10 mg total) by mouth daily with breakfast.   glucose blood (ACCU-CHEK AVIVA PLUS) test strip 1 each by Other route 2 (two) times daily. Use to check blood sugars twice a day Dx E11.9   glucose blood (TRUE METRIX BLOOD GLUCOSE TEST) test strip TEST BLOOD SUGAR TWICE DAILY   glucose blood test strip 1 each by Other route 2 (two) times daily. Use as instructed   omeprazole (PRILOSEC) 40 MG capsule Take 1 capsule (40 mg total) by mouth daily.   ONE TOUCH LANCETS MISC Use as directed twice per day   pioglitazone (ACTOS) 45 MG tablet TAKE 1 TABLET EVERY DAY (NEED MD APPOINTMENT)   sertraline (ZOLOFT) 100 MG tablet Take 1 tablet (100 mg total) by mouth daily.   sitaGLIPtin (JANUVIA) 50 MG tablet Take 1 tablet (50 mg total) by mouth daily.   tamsulosin (FLOMAX) 0.4 MG CAPS capsule Take 1 capsule (0.4 mg total) by mouth in the morning and at bedtime.   No facility-administered encounter medications on file as of 03/27/2022.    Allergies (verified) Metformin and related and Ozempic (0.25 or 0.5 mg-dose) [semaglutide(0.25 or 0.34m-dos)]   History: Past Medical History:  Diagnosis Date   Diabetes mellitus without complication (HCC)    Erectile dysfunction  GERD (gastroesophageal reflux disease)    Hypogonadism in male    Past Surgical History:  Procedure Laterality Date   right arm torn ligaments Right approx 1985   TONSILLECTOMY     Family History  Problem Relation Age of Onset   Colon cancer Father    Bone cancer Mother    Social History   Socioeconomic History   Marital status: Married    Spouse name: Not on file   Number of children: Not on file   Years of education: Not on file   Highest education level: Not on file  Occupational History   Not on file  Tobacco Use   Smoking status: Former   Smokeless tobacco: Former  IT trainer Use: Never used  Substance and Sexual Activity   Alcohol use: No    Alcohol/week: 0.0 standard drinks of alcohol   Drug use: No   Sexual activity: Not on file  Other Topics Concern   Not on file  Social History Narrative   Not on file   Social Determinants of Health   Financial Resource Strain: Low Risk  (02/16/2020)   Overall Financial Resource Strain (CARDIA)    Difficulty of Paying Living Expenses: Not hard at all  Food Insecurity: No Food Insecurity (02/16/2020)   Hunger Vital Sign    Worried About Running Out of Food in the Last Year: Never true    [place] in the Last Year: Never true  Transportation Needs: No Transportation Needs (02/16/2020)   PRAPARE - Hydrologist (Medical): No    Lack of Transportation (Non-Medical): No  Physical Activity: Not on file  Stress: No Stress Concern Present (02/16/2020)   [place]    Feeling of Stress : Not at all  Social Connections: [place] (02/16/2020)   Social Connection and Isolation Panel [NHANES]    Frequency of Communication with Friends and Family: More than three times a week    Frequency of Social Gatherings with Friends and Family: More than three times a week    Attends Religious Services: More than 4 times per year    Active Member of [employer] or Organizations: Yes    Attends Music therapist: More than 4 times per year    Marital Status: Married    Tobacco Counseling Counseling given: Not Answered   Clinical Intake:                 Diabetic?Yes Nutrition Risk Assessment:  Has the patient had any N/V/D within the last 2 months?  {YES/NO:21197} Does the patient have any non-healing wounds?  {YES/NO:21197} Has the patient had any unintentional weight loss or weight gain?  {YES/NO:21197}  Diabetes:  Is the patient diabetic?  {YES/NO:21197} If diabetic, was a CBG obtained  today?  {YES/NO:21197} Did the patient bring in their glucometer from home?  {YES/NO:21197} How often do you monitor your CBG's? ***.   Financial Strains and Diabetes Management:  Are you having any financial strains with the device, your supplies or your medication? {YES/NO:21197}.  Does the patient want to be seen by Chronic Care Management for management of their diabetes?  {YES/NO:21197} Would the patient like to be referred to a Nutritionist or for Diabetic Management?  {YES/NO:21197}  Diabetic Exams:  {Diabetic Eye Exam:2101801} {Diabetic Foot Exam:2101802}          Activities of Daily Living     No data  to display           Patient Care Team: Biagio Borg, MD as PCP - General (Internal Medicine) Rutherford Guys, MD as Consulting Physician (Ophthalmology)  Indicate any recent Medical Services you may have received from other than [facility] providers in the past year (date may be approximate).     Assessment:   This is a routine wellness examination for Mable.  Hearing/Vision screen No results found.  Dietary issues and exercise activities discussed:     Goals Addressed   None   Depression Screen    03/20/2022   10:06 AM 09/12/2021    9:52 AM 09/06/2020    3:45 PM 09/06/2020    3:14 PM 02/16/2020    8:42 AM 07/22/2019   12:11 PM 04/30/2017    2:00 PM  PHQ 2/9 Scores  PHQ - 2 Score 2 1 0 0 0 0 0  PHQ- 9 Score 2 4     0    Fall Risk    03/20/2022   10:06 AM 09/12/2021    9:52 AM 09/06/2020    3:45 PM 09/06/2020    3:14 PM 02/16/2020    8:44 AM  Fall Risk   Falls in the past year? 0 0 0 0 0  Number falls in past yr: 0 0  0 0  Injury with Fall? 0 0  0 0  Risk for fall due to : No Fall Risks    No Fall Risks  Follow up Falls evaluation completed    Falls evaluation completed;Education provided    FALL RISK PREVENTION PERTAINING TO THE HOME:  Any stairs in or around the home? {YES/NO:21197} If so, are there any without handrails? {YES/NO:21197} Home free  of loose throw rugs in walkways, pet beds, electrical cords, etc? {YES/NO:21197} Adequate lighting in your home to reduce risk of falls? {YES/NO:21197}  ASSISTIVE DEVICES UTILIZED TO PREVENT FALLS:  Life alert? {YES/NO:21197} Use of a cane, walker or w/c? {YES/NO:21197} Grab bars in the bathroom? {YES/NO:21197} Shower chair or bench in shower? {YES/NO:21197} Elevated toilet seat or a handicapped toilet? {YES/NO:21197}  Cognitive Function:        02/16/2020    8:45 AM  6CIT Screen  What Year? 0 points  What month? 0 points  What time? 0 points  Count back from 20 0 points  Months in reverse 0 points  Repeat phrase 0 points  Total Score 0 points    Immunizations Immunization History  Administered Date(s) Administered   Fluad Quad(high Dose 65+) 03/08/2020, 03/20/2022   Influenza Split 02/13/2015   Influenza, High Dose Seasonal PF 04/27/2016, 04/29/2018, 03/03/2019   Influenza,inj,Quad PF,6+ Mos 05/07/2014   Influenza-Unspecified 03/15/2017   PFIZER(Purple Top)SARS-COV-2 Vaccination 06/29/2019, 07/22/2019, 01/26/2020, 04/22/2021   Pneumococcal Conjugate-13 05/29/2014   Pneumococcal Polysaccharide-23 04/27/2016   Pneumococcal-Unspecified 04/27/2016   Td (Adult) 06/02/2015   Tdap 05/07/2014, 11/24/2020   Zoster Recombinat (Shingrix) 04/22/2021, 07/18/2021    TDAP status: Up to date  Flu Vaccine status: Up to date  Pneumococcal vaccine status: Up to date  Covid-19 vaccine status: Information provided on how to obtain vaccines.   Qualifies for Shingles Vaccine? Yes   Zostavax completed No   Shingrix Completed?: Yes  Screening Tests Health Maintenance  Topic Date Due   COVID-19 Vaccine (5 - Pfizer series) 06/17/2021   OPHTHALMOLOGY EXAM  02/08/2022   Diabetic kidney evaluation - Urine ACR  09/13/2022   FOOT EXAM  09/13/2022   HEMOGLOBIN A1C  09/18/2022   Diabetic kidney  evaluation - GFR measurement  03/21/2023   Medicare Annual Wellness (AWV)  03/28/2023    COLONOSCOPY (Pts 45-3yr Insurance coverage will need to be confirmed)  07/10/2024   TETANUS/TDAP  11/25/2030   Pneumonia Vaccine 71 Years old  Completed   INFLUENZA VACCINE  Completed   Hepatitis C Screening  Completed   Zoster Vaccines- Shingrix  Completed   HPV VACCINES  Aged Out    Health Maintenance  Health Maintenance Due  Topic Date Due   COVID-19 Vaccine (5 - Pfizer series) 06/17/2021   OPHTHALMOLOGY EXAM  02/08/2022    Colorectal cancer screening: Type of screening: Colonoscopy. Completed 07/10/14. Repeat every 10 years  Lung Cancer Screening: (Low Dose CT Chest recommended if Age 588-80years, 30 pack-year currently smoking OR have quit w/in 15years.) does not qualify.   Additional Screening:  Hepatitis C Screening: does qualify; Completed 04/27/16  Vision Screening: Recommended annual ophthalmology exams for early detection of glaucoma and other disorders of the eye. Is the patient up to date with their annual eye exam?  {YES/NO:21197} Who is the provider or what is the name of the office in which the patient attends annual eye exams? *** If pt is not established with a provider, would they like to be referred to a provider to establish care? {YES/NO:21197}.   Dental Screening: Recommended annual dental exams for proper oral hygiene  Community Resource Referral / Chronic Care Management: CRR required this visit?  {YES/NO:21197}  CCM required this visit?  {YES/NO:21197}     Plan:     I have personally reviewed and noted the following in the patient's chart:   Medical and social history Use of alcohol, tobacco or illicit drugs  Current medications and supplements including opioid prescriptions. Patient is not currently taking opioid prescriptions. Functional ability and status Nutritional status Physical activity Advanced directives List of other physicians Hospitalizations, surgeries, and ER visits in previous 12 months Vitals Screenings to include  cognitive, depression, and falls Referrals and appointments  In addition, I have reviewed and discussed with patient certain preventive protocols, quality metrics, and best practice recommendations. A written personalized care plan for preventive services as well as general preventive health recommendations were provided to patient.     JMichiel Cowboy RN   03/26/2022   Nurse Notes:  Mr. FKandler, Thank you for taking time to come for your Medicare Wellness Visit. I appreciate your ongoing commitment to your health goals. Please review the following plan we discussed and let me know if I can assist you in the future.   These are the goals we discussed:  Goals   None     This is a list of the screening recommended for you and due dates:  Health Maintenance  Topic Date Due   COVID-19 Vaccine (5 - Pfizer series) 06/17/2021   Eye exam for diabetics  02/08/2022   Yearly kidney health urinalysis for diabetes  09/13/2022   Complete foot exam   09/13/2022   Hemoglobin A1C  09/18/2022   Yearly kidney function blood test for diabetes  03/21/2023   Medicare Annual Wellness Visit  03/28/2023   Colon Cancer Screening  07/10/2024   Tetanus Vaccine  11/25/2030   Pneumonia Vaccine  Completed   Flu Shot  Completed   Hepatitis C Screening: USPSTF Recommendation to screen - Ages 18-79 yo.  Completed   Zoster (Shingles) Vaccine  Completed   HPV Vaccine  Aged Out

## 2022-03-27 ENCOUNTER — Ambulatory Visit: Payer: Medicare HMO | Admitting: *Deleted

## 2022-03-27 DIAGNOSIS — I700 Atherosclerosis of aorta: Secondary | ICD-10-CM | POA: Diagnosis not present

## 2022-03-27 DIAGNOSIS — Z1159 Encounter for screening for other viral diseases: Secondary | ICD-10-CM | POA: Diagnosis not present

## 2022-03-27 DIAGNOSIS — R7989 Other specified abnormal findings of blood chemistry: Secondary | ICD-10-CM | POA: Diagnosis not present

## 2022-03-27 DIAGNOSIS — Z23 Encounter for immunization: Secondary | ICD-10-CM | POA: Diagnosis not present

## 2022-03-27 DIAGNOSIS — E559 Vitamin D deficiency, unspecified: Secondary | ICD-10-CM | POA: Diagnosis not present

## 2022-03-27 DIAGNOSIS — Z0000 Encounter for general adult medical examination without abnormal findings: Secondary | ICD-10-CM

## 2022-03-27 DIAGNOSIS — E1151 Type 2 diabetes mellitus with diabetic peripheral angiopathy without gangrene: Secondary | ICD-10-CM | POA: Diagnosis not present

## 2022-03-27 DIAGNOSIS — Z125 Encounter for screening for malignant neoplasm of prostate: Secondary | ICD-10-CM | POA: Diagnosis not present

## 2022-03-27 DIAGNOSIS — I10 Essential (primary) hypertension: Secondary | ICD-10-CM | POA: Diagnosis not present

## 2022-03-27 DIAGNOSIS — M159 Polyosteoarthritis, unspecified: Secondary | ICD-10-CM | POA: Diagnosis not present

## 2022-03-27 DIAGNOSIS — Z79899 Other long term (current) drug therapy: Secondary | ICD-10-CM | POA: Diagnosis not present

## 2022-04-10 DIAGNOSIS — Z0001 Encounter for general adult medical examination with abnormal findings: Secondary | ICD-10-CM | POA: Diagnosis not present

## 2022-04-10 DIAGNOSIS — K219 Gastro-esophageal reflux disease without esophagitis: Secondary | ICD-10-CM | POA: Diagnosis not present

## 2022-04-10 DIAGNOSIS — E1151 Type 2 diabetes mellitus with diabetic peripheral angiopathy without gangrene: Secondary | ICD-10-CM | POA: Diagnosis not present

## 2022-04-10 DIAGNOSIS — E663 Overweight: Secondary | ICD-10-CM | POA: Diagnosis not present

## 2022-04-10 DIAGNOSIS — F320 Major depressive disorder, single episode, mild: Secondary | ICD-10-CM | POA: Diagnosis not present

## 2022-04-10 DIAGNOSIS — Z7189 Other specified counseling: Secondary | ICD-10-CM | POA: Diagnosis not present

## 2022-04-10 DIAGNOSIS — E559 Vitamin D deficiency, unspecified: Secondary | ICD-10-CM | POA: Diagnosis not present

## 2022-04-10 DIAGNOSIS — I10 Essential (primary) hypertension: Secondary | ICD-10-CM | POA: Diagnosis not present

## 2022-04-10 DIAGNOSIS — K5900 Constipation, unspecified: Secondary | ICD-10-CM | POA: Diagnosis not present

## 2022-05-04 ENCOUNTER — Telehealth: Payer: Self-pay | Admitting: Internal Medicine

## 2022-05-04 NOTE — Telephone Encounter (Signed)
N/A unable to leave a  message for patient to call back to schedule Medicare Annual Wellness Visit   Last AWV  02/16/20  Please schedule at anytime with [facility] if patient calls the office back.     Any questions, please call me at [phone]

## 2022-05-19 ENCOUNTER — Telehealth: Payer: Self-pay

## 2022-05-19 NOTE — Telephone Encounter (Signed)
[facility] requested pt flu vaccine documentation send/ faxed

## 2022-07-25 ENCOUNTER — Telehealth: Payer: Self-pay

## 2022-07-25 NOTE — Telephone Encounter (Signed)
Called patient to schedule Medicare Annual Wellness Visit (AWV). Left message for patient to call back and schedule Medicare Annual Wellness Visit (AWV).  Last date of AWV: 02/16/20  Please schedule an appointment at any time with [facility].   Norton Blizzard, [place] (AAMA)  [facility] [phone]

## 2022-09-18 ENCOUNTER — Ambulatory Visit: Payer: Medicare HMO | Admitting: Internal Medicine

## 2022-09-30 ENCOUNTER — Other Ambulatory Visit: Payer: Self-pay | Admitting: Internal Medicine

## 2022-09-30 DIAGNOSIS — E1110 Type 2 diabetes mellitus with ketoacidosis without coma: Secondary | ICD-10-CM

## 2022-10-02 ENCOUNTER — Other Ambulatory Visit: Payer: Self-pay

## 2024-03-23 ENCOUNTER — Emergency Department (HOSPITAL_COMMUNITY)
Admission: EM | Admit: 2024-03-23 | Discharge: 2024-03-23 | Disposition: A | Discharge status: Home / Self Care | Payer: Medicare (Managed Care) | Attending: Emergency Medicine | Admitting: Emergency Medicine

## 2024-03-23 ENCOUNTER — Other Ambulatory Visit: Payer: Self-pay

## 2024-03-23 ENCOUNTER — Emergency Department (HOSPITAL_COMMUNITY): Payer: Medicare (Managed Care)

## 2024-03-23 ENCOUNTER — Encounter (HOSPITAL_COMMUNITY): Payer: Self-pay

## 2024-03-23 DIAGNOSIS — R109 Unspecified abdominal pain: Secondary | ICD-10-CM | POA: Insufficient documentation

## 2024-03-23 DIAGNOSIS — N200 Calculus of kidney: Secondary | ICD-10-CM | POA: Insufficient documentation

## 2024-03-23 DIAGNOSIS — R739 Hyperglycemia, unspecified: Secondary | ICD-10-CM | POA: Insufficient documentation

## 2024-03-23 DIAGNOSIS — M545 Low back pain, unspecified: Secondary | ICD-10-CM | POA: Insufficient documentation

## 2024-03-23 DIAGNOSIS — Z7982 Long term (current) use of aspirin: Secondary | ICD-10-CM | POA: Diagnosis not present

## 2024-03-23 DIAGNOSIS — Q631 Lobulated, fused and horseshoe kidney: Secondary | ICD-10-CM | POA: Insufficient documentation

## 2024-03-23 DIAGNOSIS — R10A2 Flank pain, left side: Secondary | ICD-10-CM | POA: Diagnosis present

## 2024-03-23 LAB — COMPREHENSIVE METABOLIC PANEL WITH GFR
ALT: 27 U/L (ref 0–44)
AST: 24 U/L (ref 15–41)
Albumin: 3.4 g/dL — ABNORMAL LOW (ref 3.5–5.0)
Alkaline Phosphatase: 67 U/L (ref 38–126)
Anion gap: 9 (ref 5–15)
BUN: 19 mg/dL (ref 8–23)
CO2: 20 mmol/L — ABNORMAL LOW (ref 22–32)
Calcium: 8.8 mg/dL — ABNORMAL LOW (ref 8.9–10.3)
Chloride: 108 mmol/L (ref 98–111)
Creatinine, Ser: 1.43 mg/dL — ABNORMAL HIGH (ref 0.61–1.24)
GFR, Estimated: 52 mL/min — ABNORMAL LOW
Glucose, Bld: 255 mg/dL — ABNORMAL HIGH (ref 70–99)
Potassium: 4.8 mmol/L (ref 3.5–5.1)
Sodium: 137 mmol/L (ref 135–145)
Total Bilirubin: 0.3 mg/dL (ref 0.0–1.2)
Total Protein: 6.3 g/dL — ABNORMAL LOW (ref 6.5–8.1)

## 2024-03-23 LAB — URINALYSIS, ROUTINE W REFLEX MICROSCOPIC
Bacteria, UA: NONE SEEN
Bilirubin Urine: NEGATIVE
Glucose, UA: >=500 mg/dL — AB
Hgb urine dipstick: NEGATIVE
Ketones, ur: NEGATIVE mg/dL
Leukocytes,Ua: NEGATIVE
Nitrite: NEGATIVE
Protein, ur: NEGATIVE mg/dL
Specific Gravity, Urine: 1.025 (ref 1.005–1.030)
pH: 5 (ref 5.0–8.0)

## 2024-03-23 LAB — CBC
HCT: 41.4 % (ref 39.0–52.0)
Hemoglobin: 13.8 g/dL (ref 13.0–17.0)
MCH: 30.2 pg (ref 26.0–34.0)
MCHC: 33.3 g/dL (ref 30.0–36.0)
MCV: 90.6 fL (ref 80.0–100.0)
Platelets: 188 K/uL (ref 150–400)
RBC: 4.57 MIL/uL (ref 4.22–5.81)
RDW: 14.3 % (ref 11.5–15.5)
WBC: 5 K/uL (ref 4.0–10.5)
nRBC: 0 % (ref 0.0–0.2)

## 2024-03-23 LAB — LIPASE, BLOOD: Lipase: 26 U/L (ref 11–51)

## 2024-03-23 NOTE — Discharge Instructions (Signed)
 Return for any problem.  ?

## 2024-03-23 NOTE — ED Provider Notes (Signed)
 [place] EMERGENCY DEPARTMENT AT [facility] Provider Note   CSN: [id number] Arrival date & time: 03/23/24  [id number]     Patient presents with: Flank Pain   Michael Mcmillan is a 73 y.o. male.   73 year old male with prior medical history as detailed below presents for evaluation.  Patient complains of posterior left flank pain.  This been present for 3 to 4 days.  Patient's pain is worse in the morning.  It is worse with movement.  He denies pleuritic component.  He denies shortness of breath or nausea.  He denies vomiting.  He denies fever.  He denies any specific inciting event.  He denies abdominal pain, change in bowel movements, urinary symptoms.  He took some Aleve for his symptoms and that helped somewhat.  He declines pain medication at this time.  The history is provided by the patient and medical records.       Prior to Admission medications   Medication Sig Start Date End Date Taking? Authorizing Provider  ACCU-CHEK SOFTCLIX LANCETS lancets Use to help check blood sugars twice a day Dx E11.9 04/27/16   Norleen Lynwood ORN, MD  aspirin  EC 81 MG tablet Take 1 tablet (81 mg total) by mouth daily. 05/07/14   Norleen Lynwood ORN, MD  blood glucose meter kit and supplies Dispense based on patient and insurance preference. Use up to four times daily as directed. (FOR ICD-10 E10.9, E11.9). 05/28/20   Norleen Lynwood ORN, MD  Blood Glucose Monitoring Suppl (ACCU-CHEK AVIVA PLUS) w/Device KIT Use to check blood sugars twice a day. Dx E11.9 04/27/16   Norleen Lynwood ORN, MD  Blood Glucose Monitoring Suppl (TRUE METRIX METER) w/Device KIT Use to check blood sugars daily 05/28/20   Norleen Lynwood ORN, MD  celecoxib  (CELEBREX ) 200 MG capsule Take 1 capsule (200 mg total) by mouth 2 (two) times daily as needed. 01/07/18   Norleen Lynwood ORN, MD  cholecalciferol (VITAMIN D ) 1000 units tablet Take 1,000 Units by mouth daily.    [provider]  docusate sodium  (COLACE) 100 MG capsule Take 1 capsule (100 mg  total) by mouth 2 (two) times daily. 10/23/18   Norleen Lynwood ORN, MD  Dulaglutide  (TRULICITY ) 0.75 MG/0.5ML SOPN Inject 0.75 mg into the skin once a week. 03/20/22   Norleen Lynwood ORN, MD  gabapentin  (NEURONTIN ) 100 MG capsule Take 1 capsule (100 mg total) by mouth 3 (three) times daily. 09/12/21   Norleen Lynwood ORN, MD  glipiZIDE  (GLUCOTROL  XL) 10 MG 24 hr tablet Take 1 tablet (10 mg total) by mouth daily with breakfast. 11/10/21   Norleen Lynwood ORN, MD  glucose blood (ACCU-CHEK AVIVA PLUS) test strip 1 each by Other route 2 (two) times daily. Use to check blood sugars twice a day Dx E11.9 04/27/16   Norleen Lynwood ORN, MD  glucose blood (TRUE METRIX BLOOD GLUCOSE TEST) test strip TEST BLOOD SUGAR TWICE DAILY 06/08/21   Norleen Lynwood ORN, MD  glucose blood test strip 1 each by Other route 2 (two) times daily. Use as instructed 04/27/16   Norleen Lynwood ORN, MD  omeprazole  (PRILOSEC) 40 MG capsule Take 1 capsule (40 mg total) by mouth daily. 09/12/21   Norleen Lynwood ORN, MD  ONE [facility] LANCETS MISC Use as directed twice per day 04/27/16   Norleen Lynwood ORN, MD  pioglitazone  (ACTOS ) 45 MG tablet TAKE 1 TABLET EVERY DAY (NEED MD APPOINTMENT) 10/02/22   Norleen Lynwood ORN, MD  sertraline  (ZOLOFT ) 100 MG tablet Take 1  tablet (100 mg total) by mouth daily. 09/12/21   Norleen Lynwood ORN, MD  sitaGLIPtin  (JANUVIA ) 50 MG tablet Take 1 tablet (50 mg total) by mouth daily. 03/20/22   Norleen Lynwood ORN, MD  tamsulosin  (FLOMAX ) 0.4 MG CAPS capsule Take 1 capsule (0.4 mg total) by mouth in the morning and at bedtime. 09/12/21   Norleen Lynwood ORN, MD    Allergies: Metformin  and related and Ozempic  (0.25 or 0.5 mg-dose) [semaglutide (0.25 or 0.5mg -dos)]    Review of Systems  All other systems reviewed and are negative.   Updated Vital Signs BP 138/73 (BP Location: Right Arm)   Pulse 95   Temp 98.3 F (36.8 C) (Oral)   Resp 17   Ht 5' 5 (1.651 m)   Wt 74.8 kg   SpO2 99%   BMI 27.46 kg/m   Physical Exam Vitals and nursing note reviewed.  Constitutional:       General: He is not in acute distress.    Appearance: He is well-developed.  HENT:     Head: Normocephalic and atraumatic.  Eyes:     Conjunctiva/sclera: Conjunctivae normal.  Cardiovascular:     Rate and Rhythm: Normal rate and regular rhythm.     Heart sounds: No murmur heard. Pulmonary:     Effort: Pulmonary effort is normal. No respiratory distress.     Breath sounds: Normal breath sounds.  Abdominal:     Palpations: Abdomen is soft.     Tenderness: There is no abdominal tenderness.  Musculoskeletal:        General: No swelling.     Cervical back: Neck supple.     Comments: Mild tenderness to palpation overlying the left low lumbar musculature.  No midline tenderness.  No break in the skin.  No rash.  No ecchymosis.  No abdominal tenderness.  No CVA tenderness.  Skin:    General: Skin is warm and dry.     Capillary Refill: Capillary refill takes less than 2 seconds.  Neurological:     Mental Status: He is alert.  Psychiatric:        Mood and Affect: Mood normal.     (all labs ordered are listed, but only abnormal results are displayed) Labs Reviewed  COMPREHENSIVE METABOLIC PANEL WITH GFR - Abnormal; Notable for the following components:      Result Value   CO2 20 (*)    Glucose, Bld 255 (*)    Creatinine, Ser 1.43 (*)    Calcium 8.8 (*)    Total Protein 6.3 (*)    Albumin 3.4 (*)    GFR, Estimated 52 (*)    All other components within normal limits  URINALYSIS, ROUTINE W REFLEX MICROSCOPIC - Abnormal; Notable for the following components:   Glucose, UA >=500 (*)    All other components within normal limits  LIPASE, BLOOD  CBC    EKG: None  Radiology: No results found.   Procedures   Medications Ordered in the ED - No data to display                                  Medical Decision Making Patient presents with low back pain.  Described symptoms and exam are most consistent with musculoskeletal pain.  Obtained workup including CT imaging is  without acute abnormality identified.  Patient's labs are without significant abnormality other than mild hyperglycemia and mild elevation in creatinine.  Patient reports that he  has not taken his medicines this morning.  CT imaging did not demonstrate evidence of significant acute pathology.  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  Patient is appropriate for discharge home.  He understands need for close outpatient follow-up.  Strict return precautions given understood.  Amount and/or Complexity of Data Reviewed Labs: ordered. Radiology: ordered.        Final diagnoses:  Acute left-sided low back pain without sciatica    ED Discharge Orders     None          Laurice Maude BROCKS, MD 03/23/24 [phone]

## 2024-03-23 NOTE — ED Triage Notes (Signed)
 Pt c/o left flank pain x 3-4 days. Pain has worsened this am. Pt denies nausea, vomiting, diarrhea, constipation or urinary symptoms.
